# Patient Record
Sex: Female | Born: 1980 | Race: Black or African American | Hispanic: No | Marital: Single | Smoking: Never smoker
Health system: Southern US, Community
[De-identification: ages and names within clinical notes are randomized; demographics above are authoritative.]

## PROBLEM LIST (undated history)

## (undated) DIAGNOSIS — G43909 Migraine, unspecified, not intractable, without status migrainosus: Secondary | ICD-10-CM

## (undated) DIAGNOSIS — E282 Polycystic ovarian syndrome: Secondary | ICD-10-CM

## (undated) DIAGNOSIS — E559 Vitamin D deficiency, unspecified: Secondary | ICD-10-CM

## (undated) DIAGNOSIS — R5383 Other fatigue: Secondary | ICD-10-CM

## (undated) DIAGNOSIS — N912 Amenorrhea, unspecified: Secondary | ICD-10-CM

## (undated) HISTORY — DX: Amenorrhea, unspecified: N91.2

## (undated) HISTORY — DX: Migraine, unspecified, not intractable, without status migrainosus: G43.909

## (undated) HISTORY — DX: Other fatigue: R53.83

## (undated) HISTORY — DX: Vitamin D deficiency, unspecified: E55.9

## (undated) HISTORY — PX: FEMUR FRACTURE SURGERY: SHX633

## (undated) HISTORY — PX: KNEE SURGERY: SHX244

## (undated) HISTORY — PX: LEG SURGERY: SHX1003

## (undated) HISTORY — PX: FRACTURE SURGERY: SHX138

---

## 2014-06-03 ENCOUNTER — Encounter (HOSPITAL_COMMUNITY): Payer: Self-pay | Admitting: Emergency Medicine

## 2014-06-03 ENCOUNTER — Emergency Department (HOSPITAL_COMMUNITY): Payer: 59

## 2014-06-03 ENCOUNTER — Emergency Department (HOSPITAL_COMMUNITY)
Admission: EM | Admit: 2014-06-03 | Discharge: 2014-06-03 | Disposition: A | Discharge status: Home / Self Care | Payer: 59 | Attending: Emergency Medicine | Admitting: Emergency Medicine

## 2014-06-03 DIAGNOSIS — Z3202 Encounter for pregnancy test, result negative: Secondary | ICD-10-CM | POA: Insufficient documentation

## 2014-06-03 DIAGNOSIS — R0602 Shortness of breath: Secondary | ICD-10-CM | POA: Insufficient documentation

## 2014-06-03 DIAGNOSIS — Z791 Long term (current) use of non-steroidal anti-inflammatories (NSAID): Secondary | ICD-10-CM | POA: Diagnosis not present

## 2014-06-03 DIAGNOSIS — R079 Chest pain, unspecified: Secondary | ICD-10-CM | POA: Diagnosis present

## 2014-06-03 DIAGNOSIS — Z79899 Other long term (current) drug therapy: Secondary | ICD-10-CM | POA: Diagnosis not present

## 2014-06-03 DIAGNOSIS — Z793 Long term (current) use of hormonal contraceptives: Secondary | ICD-10-CM | POA: Insufficient documentation

## 2014-06-03 LAB — CBC
HCT: 40.4 % (ref 36.0–46.0)
Hemoglobin: 13.8 g/dL (ref 12.0–15.0)
MCH: 29.9 pg (ref 26.0–34.0)
MCHC: 34.2 g/dL (ref 30.0–36.0)
MCV: 87.4 fL (ref 78.0–100.0)
Platelets: 373 10*3/uL (ref 150–400)
RBC: 4.62 MIL/uL (ref 3.87–5.11)
RDW: 12.7 % (ref 11.5–15.5)
WBC: 6 10*3/uL (ref 4.0–10.5)

## 2014-06-03 LAB — URINE MICROSCOPIC-ADD ON

## 2014-06-03 LAB — D-DIMER, QUANTITATIVE: D-Dimer, Quant: 0.39 ug{FEU}/mL (ref 0.00–0.48)

## 2014-06-03 LAB — I-STAT TROPONIN, ED
TROPONIN I, POC: 0.01 ng/mL (ref 0.00–0.08)
Troponin i, poc: 0 ng/mL (ref 0.00–0.08)

## 2014-06-03 LAB — BASIC METABOLIC PANEL
ANION GAP: 13 (ref 5–15)
BUN: 9 mg/dL (ref 6–23)
CHLORIDE: 99 meq/L (ref 96–112)
CO2: 25 mEq/L (ref 19–32)
Calcium: 8.9 mg/dL (ref 8.4–10.5)
Creatinine, Ser: 0.98 mg/dL (ref 0.50–1.10)
GFR, EST AFRICAN AMERICAN: 89 mL/min — AB (ref >90)
GFR, EST NON AFRICAN AMERICAN: 77 mL/min — AB (ref >90)
Glucose, Bld: 88 mg/dL (ref 70–99)
POTASSIUM: 3.8 meq/L (ref 3.7–5.3)
SODIUM: 137 meq/L (ref 137–147)

## 2014-06-03 LAB — URINALYSIS, ROUTINE W REFLEX MICROSCOPIC
Bilirubin Urine: NEGATIVE
Glucose, UA: NEGATIVE mg/dL
Ketones, ur: 15 mg/dL — AB
Leukocytes, UA: NEGATIVE
Nitrite: NEGATIVE
Protein, ur: NEGATIVE mg/dL
Specific Gravity, Urine: 1.03 (ref 1.005–1.030)
Urobilinogen, UA: 0.2 mg/dL (ref 0.0–1.0)
pH: 5.5 (ref 5.0–8.0)

## 2014-06-03 LAB — PREGNANCY, URINE: Preg Test, Ur: NEGATIVE

## 2014-06-03 MED ORDER — MORPHINE SULFATE 2 MG/ML IJ SOLN
2.0000 mg | Freq: Once | INTRAMUSCULAR | Status: AC
  Administered 2014-06-03: 2 mg via INTRAVENOUS
  Filled 2014-06-03: qty 1

## 2014-06-03 MED ORDER — SODIUM CHLORIDE 0.9 % IV SOLN
INTRAVENOUS | Status: DC
  Administered 2014-06-03: 14:00:00 via INTRAVENOUS

## 2014-06-03 MED ORDER — TRAMADOL HCL 50 MG PO TABS: 50.0000 mg | ORAL_TABLET | Freq: Four times a day (QID) | ORAL | Status: DC | PRN

## 2014-06-03 MED ORDER — ONDANSETRON HCL 4 MG/2ML IJ SOLN
4.0000 mg | Freq: Once | INTRAMUSCULAR | Status: AC
  Administered 2014-06-03: 4 mg via INTRAVENOUS
  Filled 2014-06-03: qty 2

## 2014-06-03 MED ORDER — SODIUM CHLORIDE 0.9 % IV BOLUS (SEPSIS)
250.0000 mL | Freq: Once | INTRAVENOUS | Status: AC
  Administered 2014-06-03: 250 mL via INTRAVENOUS

## 2014-06-03 NOTE — ED Provider Notes (Signed)
CSN: [id number]     Arrival date & time 06/03/14  1200 History   First MD Initiated Contact with Patient 06/03/14 1203     Chief Complaint  Patient presents with  . Chest Pain     (Consider location/radiation/quality/duration/timing/severity/associated sxs/prior Treatment) Patient is a 33 y.o. female presenting with chest pain. The history is provided by the patient and the EMS personnel.  Chest Pain Associated symptoms: shortness of breath   Associated symptoms: no abdominal pain, no back pain, no fever, no headache, no nausea and not vomiting   patient with acute onset of chest pain at work this morning at 10:30. Pain is 9 out of 10. Was given aspirin at work before EMS arrived.patient says the pain was substernal tightness to sharp waxing and waning. Made worse by taking a deep breath. Nonradiating. No fever no upper rest for infection no nausea no vomiting. Never had pain like this before.  History reviewed. No pertinent past medical history. Past Surgical History  Procedure Laterality Date  . Fracture surgery    . Knee surgery     History reviewed. No pertinent family history. History  Substance Use Topics  . Smoking status: Never Smoker   . Smokeless tobacco: Never Used  . Alcohol Use: Yes     Comment: occasionally    OB History    No data available     Review of Systems  Constitutional: Negative for fever.  HENT: Negative for congestion.   Eyes: Negative for visual disturbance.  Respiratory: Positive for shortness of breath.   Cardiovascular: Positive for chest pain.  Gastrointestinal: Negative for nausea, vomiting and abdominal pain.  Genitourinary: Negative for dysuria.  Musculoskeletal: Negative for back pain.  Skin: Negative for rash.  Neurological: Negative for headaches.  Hematological: Does not bruise/bleed easily.  Psychiatric/Behavioral: Negative for confusion.      Allergies  Adhesive  Home Medications   Prior to Admission medications    Medication Sig Start Date End Date Taking? Authorizing Provider  meloxicam (MOBIC) 15 MG tablet Take 15 mg by mouth daily.   Yes Historical Provider, MD  valACYclovir (VALTREX) 500 MG tablet Take 250-500 mg by mouth 2 (two) times daily. Take 1 tablet by mouth twice a day for 5 days, then take 1 tablet every day as needed   Yes Historical Provider, MD  traMADol (ULTRAM) 50 MG tablet Take 1 tablet (50 mg total) by mouth every 6 (six) hours as needed. 06/03/14   Vanetta MuldersScott Katalaya Beel, MD   BP 139/78 mmHg  Pulse 88  Temp(Src) 98.7 F (37.1 C) (Oral)  Resp 20  Ht 5\' 7"  (1.702 m)  Wt 218 lb (98.884 kg)  BMI 34.14 kg/m2  SpO2 97%  LMP 06/01/2014 Physical Exam  Constitutional: She is oriented to person, place, and time. She appears well-developed and well-nourished.  HENT:  Head: Normocephalic and atraumatic.  Mouth/Throat: Oropharynx is clear and moist.  Eyes: Conjunctivae and EOM are normal.  Neck: Normal range of motion. Neck supple.  Cardiovascular: Normal rate, regular rhythm and normal heart sounds.   No murmur heard. Pulmonary/Chest: Effort normal and breath sounds normal.  Abdominal: Soft. Bowel sounds are normal. There is no tenderness.  Musculoskeletal: Normal range of motion.  Neurological: She is alert and oriented to person, place, and time. No cranial nerve deficit. She exhibits normal muscle tone. Coordination normal.  Skin: Skin is warm. No rash noted.  Nursing note and vitals reviewed.   ED Course  Procedures (including critical care time) Labs Review  Labs Reviewed  BASIC METABOLIC PANEL - Abnormal; Notable for the following:    GFR calc non Af Amer 77 (*)    GFR calc Af Amer 89 (*)    All other components within normal limits  URINALYSIS, ROUTINE W REFLEX MICROSCOPIC - Abnormal; Notable for the following:    Color, Urine AMBER (*)    Hgb urine dipstick LARGE (*)    Ketones, ur 15 (*)    All other components within normal limits  URINE MICROSCOPIC-ADD ON - Abnormal;  Notable for the following:    Squamous Epithelial / LPF FEW (*)    Bacteria, UA FEW (*)    All other components within normal limits  CBC  PREGNANCY, URINE  D-DIMER, QUANTITATIVE  I-STAT TROPOININ, ED  POC URINE PREG, ED  I-STAT TROPOININ, ED   Results for orders placed or performed during the hospital encounter of 06/03/14  CBC  Result Value Ref Range   WBC 6.0 4.0 - 10.5 K/uL   RBC 4.62 3.87 - 5.11 MIL/uL   Hemoglobin 13.8 12.0 - 15.0 g/dL   HCT 16.140.4 09.636.0 - 04.546.0 %   MCV 87.4 78.0 - 100.0 fL   MCH 29.9 26.0 - 34.0 pg   MCHC 34.2 30.0 - 36.0 g/dL   RDW 40.912.7 81.111.5 - 91.415.5 %   Platelets 373 150 - 400 K/uL  Basic metabolic panel  Result Value Ref Range   Sodium 137 137 - 147 mEq/L   Potassium 3.8 3.7 - 5.3 mEq/L   Chloride 99 96 - 112 mEq/L   CO2 25 19 - 32 mEq/L   Glucose, Bld 88 70 - 99 mg/dL   BUN 9 6 - 23 mg/dL   Creatinine, Ser 7.820.98 0.50 - 1.10 mg/dL   Calcium 8.9 8.4 - 95.610.5 mg/dL   GFR calc non Af Amer 77 (L) >90 mL/min   GFR calc Af Amer 89 (L) >90 mL/min   Anion gap 13 5 - 15  Urinalysis, Routine w reflex microscopic  Result Value Ref Range   Color, Urine AMBER (A) YELLOW   APPearance CLEAR CLEAR   Specific Gravity, Urine 1.030 1.005 - 1.030   pH 5.5 5.0 - 8.0   Glucose, UA NEGATIVE NEGATIVE mg/dL   Hgb urine dipstick LARGE (A) NEGATIVE   Bilirubin Urine NEGATIVE NEGATIVE   Ketones, ur 15 (A) NEGATIVE mg/dL   Protein, ur NEGATIVE NEGATIVE mg/dL   Urobilinogen, UA 0.2 0.0 - 1.0 mg/dL   Nitrite NEGATIVE NEGATIVE   Leukocytes, UA NEGATIVE NEGATIVE  Pregnancy, urine  Result Value Ref Range   Preg Test, Ur NEGATIVE NEGATIVE  Urine microscopic-add on  Result Value Ref Range   Squamous Epithelial / LPF FEW (A) RARE   WBC, UA 0-2 <3 WBC/hpf   RBC / HPF 7-10 <3 RBC/hpf   Bacteria, UA FEW (A) RARE  D-dimer, quantitative  Result Value Ref Range   D-Dimer, Quant 0.39 0.00 - 0.48 ug/mL-FEU  I-stat troponin, ED (not at [facility])  Result Value Ref Range   Troponin i,  poc 0.00 0.00 - 0.08 ng/mL   Comment 3          I-stat troponin, ED  Result Value Ref Range   Troponin i, poc 0.01 0.00 - 0.08 ng/mL   Comment 3             Imaging Review Dg Chest 2 View  06/03/2014   CLINICAL DATA:  Chest pain  EXAM: CHEST  2 VIEW  COMPARISON:  None.  FINDINGS: The heart size and mediastinal contours are within normal limits. Both lungs are clear. The visualized skeletal structures are unremarkable.  IMPRESSION: No active cardiopulmonary disease.   Electronically Signed   By: Alcide Clever M.D.   On: 06/03/2014 14:03     EKG Interpretation   Date/Time:  Thursday June 03 2014 12:04:27 EST Ventricular Rate:  96 PR Interval:  137 QRS Duration: 60 QT Interval:  350 QTC Calculation: 442 R Axis:   22 Text Interpretation:  Sinus rhythm Low voltage, precordial leads  Borderline T abnormalities, anterior leads No previous ECGs available  Confirmed by Anesa Fronek  MD, Yasheka Fossett ([id number]) on 06/03/2014 12:59:25 PM      MDM   Final diagnoses:  Chest pain    Patient was sudden onset of chest pain at 10:30 this morning. Tightness to a sharp pain that waxes and wanes. Does not go away completely. Made worse with deep breaths. Not associated with fever no upper respiratory infections. No nausea no vomiting. Never anything like this before. Was given an aspirin at work. Pain was described as 9 out of 10.  Workup including evaluation for possible pulmonary embolus was negative. Patient troponin 2 was negative. Chest x-rays negative for any pneumonia pneumothorax or pulmonary edema. Patient's d-dimer was negative making not consistent with pulmonary embolus. Pain seems to be pleuritic in nature. We'll treat with pain medicine and that resource guide for follow-up with her record Dr.  Donn Pierini thought process on doing the d-dimer was. Based on perc criteria patient is on birth control pills.    Vanetta Mulders, MD 06/03/14 [phone]

## 2014-06-03 NOTE — ED Notes (Signed)
Pt presents to ed via ems with complaints of chest pain. Pt was at work and experienced sudden onset of central chest pain that radiates to the right chest, denies radiation anywhere else. Pt denies n/v, diaphoresis, or SOB. Pt does state when the chest pain first began she got really hot. Pt complaining of chest pain at this time 9/10. Does not appear to be in any distress.

## 2014-06-03 NOTE — ED Notes (Signed)
Pt has NS 250 bolus going now, will start 100 ml/hr after finished.

## 2014-06-03 NOTE — Discharge Instructions (Signed)
Workup for the chest pain without any significant findings. Take the tramadol as needed. Resource guide provided to help you find a regular doctor. May consider the wellness clinic on [address] as a place to start. Return for any new or worse symptoms. Work note provided.   Emergency Department Resource Guide 1) Find a Doctor and Pay Out of Pocket Although you won't have to find out who is covered by your insurance plan, it is a good idea to ask around and get recommendations. You will then need to call the office and see if the doctor you have chosen will accept you as a new patient and what types of options they offer for patients who are self-pay. Some doctors offer discounts or will set up payment plans for their patients who do not have insurance, but you will need to ask so you aren't surprised when you get to your appointment.  2) Contact Your Local Health Department Not all health departments have doctors that can see patients for sick visits, but many do, so it is worth a call to see if yours does. If you don't know where your local health department is, you can check in your phone book. The CDC also has a tool to help you locate your state's health department, and many state websites also have listings of all of their local health departments.  3) Find a Walk-in Clinic If your illness is not likely to be very severe or complicated, you may want to try a walk in clinic. These are popping up all over the country in pharmacies, drugstores, and shopping centers. They're usually staffed by nurse practitioners or physician assistants that have been trained to treat common illnesses and complaints. They're usually fairly quick and inexpensive. However, if you have serious medical issues or chronic medical problems, these are probably not your best option.  No Primary Care Doctor: - Call Health Connect at  [phone] - they can help you locate a primary care doctor that  accepts your insurance, provides  certain services, etc. - Physician Referral Service- [phone]  Chronic Pain Problems: Organization         Address  Phone   Notes  [facility]  [phone] Patients need to be referred by their primary care doctor.   Medication Assistance: Organization         Address  Phone   Notes  [facility] Medication [facility] [address] [place], [place] [address] [phone]) [phone] --Must be a resident of [facility] [place] -- Must have NO insurance coverage whatsoever (no Medicaid/ Medicare, etc.) -- The pt. MUST have a primary care doctor that directs their care regularly and follows them in the community   MedAssist  [phone]   [employer]  [phone]    Agencies that provide inexpensive medical care: Organization         Address  Phone   Notes  [facility]  [phone](336) [phone]   [facility]    [phone]   [facility] [address] [place], [place] [address] [phone]   [facility] [address], [place] [phone]   Planned Parenthood    [phone]   [facility]    [phone](336) [phone]   [facility]  [address], [place] Phone:  [phone](336) [phone], Fax:  [phone] Hours of Operation:  9 am - 6 pm,  M-F.  Also accepts Medicaid/Medicare and self-pay.  [facility]  [address], [place] Phone: [phone](336) [phone], Fax: [phone]. Hours of Operation:  8:30 am - 5:30 pm, M-F.  Also accepts Medicaid and self-pay.  [facility] [place] [address], [place] Phone: [phone]) [phone]   [facility] [address], [place], [place] [phone], Ext. [phone] Mondays & Thursdays: 7-9 AM.  First 15 patients are seen on a first come, first serve basis.    Medicaid-accepting [facility] Providers:  Organization         Address  Phone   Notes  [facility] [address], [place] [phone] Also accepts self-pay patients.  [facility] [address], [place]  [phone]) [phone]   [facility] [address], [place] [phone](336) [phone]   [facility] [address] [place] [address], [place] [phone]) [phone]   Renaye RakersVeita Bland [address], [place]   [phone] Only accepts [place] Access [place] patients after they have their name applied to their card.   Self-Pay (no insurance) in [facility]:  Organization         Address  Phone   Notes  Sickle Cell Patients, [facility] [address], [place] [phone]) [phone]   [facility] [address], [place] [phone]) [phone]   [facility] [place]  [address],  [phone](336) [phone]   [facility]/Dr. Osei-Bonsu  [address] [place] [address], [place] or [phone], [place] [phone] Phone number for both [place] and [place] locations is the same.  [facility] [address], [place] [phone]) [phone]   [facility] [address] [place] [address], [place] or [address] [phone](336) [phone]   [facility] [address], [place] [phone](336) [phone], phone; [phone]) [phone], fax Sees patients 1st and 3rd Saturday of every month.  Must not qualify for public or private insurance (i.e. Medicaid, Medicare, [place] Health Choice, Veterans' Benefits)  Household income should be no more than 200% of the poverty level The clinic cannot treat you if you are pregnant or think you are pregnant  Sexually transmitted diseases are not treated at the clinic.    Dental Care: Organization         Address  Phone  Notes  [facility] of [facility] [address], [place] [phone]) [phone]  Accepts children up to age 31 who are enrolled in [place]icaid or [place] Health Choice; pregnant women with a Medicaid card; and children who have applied for Medicaid or [place] Health Choice, but were declined, whose parents can pay a reduced fee at time of service.  [facility] Department of [facility] [place]  [address], [place] [phone] Accepts children up to age 31 who are enrolled in [place]icaid or [place] Health Choice; pregnant women with a Medicaid card; and children who have applied for Medicaid or [place] Health Choice, but were declined, whose parents can pay a reduced fee at time of service.  [facility]  [address], [place] [phone]) [phone] Patients are seen by appointment only. Walk-ins are not accepted. [facility] will see patients 31 years of age and older. Monday -  Tuesday (8am-5pm) Most Wednesdays (8:30-5pm) $30 per visit, cash only  [facility]  [address], [facility] [phone]) [phone] Patients are seen by appointment only. Walk-ins are not accepted. [facility] will see patients 31 years of age and older. One Wednesday Evening (Monthly: Volunteer Based).  $30 per visit, cash only  [employer] [facility]  [phone] for adults; Children under age 954, call Graduate Pediatric Dentistry at [phone]) [phone]. Children aged 274-14, please call [phone] to request a pediatric application.  Dental services are provided in all areas of dental care including fillings, crowns and bridges, complete and partial dentures, implants, gum treatment, root canals, and extractions. Preventive care is also provided. Treatment is provided to both adults and children. Patients are selected via a lottery and there is often a waiting list.   [facility] [address], [place]  [phone]) [phone] [email or web address]   [facility] [address], [place], [place] [phone], Ext. [phone] Second  and Fourth Thursday of each month, opens at 6:30 AM; Clinic ends at 9 AM.  Patients are seen on a first-come first-served basis, and a limited number are seen during each clinic.   [facility]  [address], [place], [place] [phone]) [phone]   Eligibility Requirements You must have lived in [place], [place], or [place] counties for at least the last three months.   You cannot be eligible for state or federal sponsored National Cityhealthcare insurance, including CIGNAVeterans Administration, [place]icaid, or [employer] EntertainmentMedicare.   You generally cannot be eligible for healthcare insurance through your employer.    How to apply: Eligibility screenings are held every Tuesday and Wednesday afternoon from 1:00 pm until 4:00 pm. You do not need an appointment for the interview!  [facility] [address], [place], [place] [phone]   [facility]  [phone]   [facility]  [phone]   [facility]  [phone]    Behavioral Health Resources in the Community: Intensive Outpatient Programs Organization         Address  Phone  Notes  [facility] [address], [place], [place] [phone]   [facility] [address], [place], [place] [phone]   [facility] [address], [place], [place]  [phone]   [facility] [address],  [place], [place] [phone] or [phone]   Substance Abuse Resources Organization         Address  Phone  Notes  [facility] Drug Services  [phone]   [facility]  [phone]   [facility]  [phone]   [facility]  [phone]   Residential & Outpatient Substance Abuse Program  [phone]   Psychological Services Organization         Address  Phone  Notes  [facility]  [phone]- [phone]   [facility]  [phone]- [phone]   [facility] [address], [place] [phone] or [phone]    Mobile Crisis Teams Organization         Address  Phone  Notes  [facility], Mobile Crisis Care Unit  [phone]   [facility]  [address] [place], [place] [phone]   Doristine LocksSharon DeEsch [address] [place] [phone]    Self-Help/Support Groups Organization         Address  Phone  Notes  [facility]ental Health Assoc[facility] of [place] - variety of support groups  [place] Call for more information  Narcotics Anonymous (NA), Caring Services [address], [employer] [place]  2 meetings at this location   Special educational needs teacher         Address  Phone  Notes  ASAP Residential Treatment [place],    [place]  [phone]   [facility]  [address], [place] [address]5790, [place], [place]   [place], [place] [phone] Admissions: 8am-3pm M-F  Incentives Substance [place] [address],    [place], [place] [phone]   [facility] [address] [place], [place], [place]   [facility] [address]  [place], [place]   [facility]ight Programs - Intensive Outpatient [address], [place], [place]   [facility] [address]  [place], [place] [phone] or [phone]   [facility] (RTS) [address] [place], [place] Accepts Medicaid  [facility] [address],  [place] [phone] Substance Abuse/Addiction Treatment   [facility] Organization         Address  Phone  Notes  [facility]  [phone]   Domenic Schwab, PhD [address] [place], [place]   [phone] or [phone]   [place], [place]  [phone]   [facility] [address], [place], [place] [phone] Insurance/Medicaid/sponsorship through [facility] and Families [address]                                    [place], [place] [phone] [place] [address] [place], [place] [phone]    Dr. Adele Schilder  [phone]   [facility] Dept. 1) [address], [place] 2) [place] 3)  [place] [address], [place] [phone]  [phone]   [place] [phone] or [phone] (After Hours)

## 2014-06-03 NOTE — ED Notes (Signed)
Pt reports pain upon taking a deep breath.

## 2015-06-28 ENCOUNTER — Emergency Department (HOSPITAL_COMMUNITY)
Admission: EM | Admit: 2015-06-28 | Discharge: 2015-06-29 | Disposition: A | Discharge status: Home / Self Care | Payer: 59 | Attending: Emergency Medicine | Admitting: Emergency Medicine

## 2015-06-28 ENCOUNTER — Encounter (HOSPITAL_COMMUNITY): Payer: Self-pay | Admitting: Emergency Medicine

## 2015-06-28 DIAGNOSIS — Y9389 Activity, other specified: Secondary | ICD-10-CM | POA: Diagnosis not present

## 2015-06-28 DIAGNOSIS — Z8639 Personal history of other endocrine, nutritional and metabolic disease: Secondary | ICD-10-CM | POA: Insufficient documentation

## 2015-06-28 DIAGNOSIS — R590 Localized enlarged lymph nodes: Secondary | ICD-10-CM | POA: Diagnosis not present

## 2015-06-28 DIAGNOSIS — Y9289 Other specified places as the place of occurrence of the external cause: Secondary | ICD-10-CM | POA: Diagnosis not present

## 2015-06-28 DIAGNOSIS — W010XXA Fall on same level from slipping, tripping and stumbling without subsequent striking against object, initial encounter: Secondary | ICD-10-CM | POA: Diagnosis not present

## 2015-06-28 DIAGNOSIS — M25561 Pain in right knee: Secondary | ICD-10-CM

## 2015-06-28 DIAGNOSIS — S8991XA Unspecified injury of right lower leg, initial encounter: Secondary | ICD-10-CM | POA: Diagnosis not present

## 2015-06-28 DIAGNOSIS — R591 Generalized enlarged lymph nodes: Secondary | ICD-10-CM

## 2015-06-28 DIAGNOSIS — Y998 Other external cause status: Secondary | ICD-10-CM | POA: Diagnosis not present

## 2015-06-28 HISTORY — DX: Polycystic ovarian syndrome: E28.2

## 2015-06-28 LAB — CBC WITH DIFFERENTIAL/PLATELET
BASOS ABS: 0 10*3/uL (ref 0.0–0.1)
BASOS PCT: 0 %
Eosinophils Absolute: 0.2 10*3/uL (ref 0.0–0.7)
Eosinophils Relative: 2 %
HEMATOCRIT: 42.3 % (ref 36.0–46.0)
HEMOGLOBIN: 14.5 g/dL (ref 12.0–15.0)
Lymphocytes Relative: 44 %
Lymphs Abs: 3.6 10*3/uL (ref 0.7–4.0)
MCH: 29.4 pg (ref 26.0–34.0)
MCHC: 34.3 g/dL (ref 30.0–36.0)
MCV: 85.6 fL (ref 78.0–100.0)
Monocytes Absolute: 0.6 10*3/uL (ref 0.1–1.0)
Monocytes Relative: 8 %
NEUTROS ABS: 3.8 10*3/uL (ref 1.7–7.7)
NEUTROS PCT: 46 %
Platelets: 354 10*3/uL (ref 150–400)
RBC: 4.94 MIL/uL (ref 3.87–5.11)
RDW: 12.8 % (ref 11.5–15.5)
WBC: 8.1 10*3/uL (ref 4.0–10.5)

## 2015-06-28 MED ORDER — SODIUM CHLORIDE 0.9 % IV BOLUS (SEPSIS)
1000.0000 mL | Freq: Once | INTRAVENOUS | Status: AC
  Administered 2015-06-28: 1000 mL via INTRAVENOUS

## 2015-06-28 MED ORDER — DEXAMETHASONE SODIUM PHOSPHATE 10 MG/ML IJ SOLN
10.0000 mg | Freq: Once | INTRAMUSCULAR | Status: AC
  Administered 2015-06-28: 10 mg via INTRAVENOUS
  Filled 2015-06-28: qty 1

## 2015-06-28 MED ORDER — BENZONATATE 100 MG PO CAPS
200.0000 mg | ORAL_CAPSULE | Freq: Once | ORAL | Status: AC
  Administered 2015-06-28: 200 mg via ORAL
  Filled 2015-06-28: qty 2

## 2015-06-28 MED ORDER — IPRATROPIUM-ALBUTEROL 0.5-2.5 (3) MG/3ML IN SOLN
3.0000 mL | RESPIRATORY_TRACT | Status: DC
  Administered 2015-06-28: 3 mL via RESPIRATORY_TRACT
  Filled 2015-06-28: qty 3

## 2015-06-28 NOTE — ED Provider Notes (Signed)
CSN: [id number]     Arrival date & time 06/28/15  2119 History   By signing my name below, I, Arianna Nassar, attest that this documentation has been prepared under the direction and in the presence of Tomasita CrumbleAdeleke Rosser [place], MD. Electronically Signed: Octavia HeirArianna Nassar, ED Scribe. 06/28/2015. 11:23 PM.     Chief Complaint  Patient presents with  . Fall  . Knee Pain  . Cyst  . Sore Throat     The history is provided by the patient. No language interpreter was used.   HPI Comments: Katrina Livingston is a 34 y.o. female who presents to the Emergency Department presenting with multiple complains. Pt is complaining of constant, moderate, "throbbing" right knee pain onset last night. Pt endorses extreme pain when bending her knee. Pt reports she fell last night when she slipped and her right knee folded underneath her. She reports hitting her head when she fell but she did not lose consciousness. She has surgery on her right knee for a torn ligament and just wanted to check and make sure she did not tear anything. She has taken ibuprofen to alleviate the pain to alleviate the pain with minimal relief. Pt also complains of a sore throat and tightness due to excess crying after a death in the family. Pt describes the pain as a hiccup feeling. She has a dry cough as well. Pt reports having "knot" like area on the left side of her neck for the past 3 days. Pt denies fever and diaphoresis.  Past Medical History  Diagnosis Date  . PCOS (polycystic ovarian syndrome)    Past Surgical History  Procedure Laterality Date  . Fracture surgery    . Knee surgery    . Leg surgery     No family history on file. Social History  Substance Use Topics  . Smoking status: Never Smoker   . Smokeless tobacco: Never Used  . Alcohol Use: Yes     Comment: occasionally    OB History    No data available     Review of Systems  A complete 10 system review of systems was obtained and all systems are negative except as noted  in the HPI and PMH.    Allergies  Adhesive  Home Medications   Prior to Admission medications   Medication Sig Start Date End Date Taking? Authorizing Provider  valACYclovir (VALTREX) 500 MG tablet Take 250-500 mg by mouth 2 (two) times daily. Take 1 tablet by mouth twice a day for 5 days, then take 1 tablet every day as needed   Yes Historical Provider, MD   Triage vitals: BP 151/99 mmHg  Pulse 89  Temp(Src) 98.5 F (36.9 C) (Oral)  Resp 20  SpO2 98%  LMP  Physical Exam  Constitutional: She is oriented to person, place, and time. She appears well-developed and well-nourished. No distress.  HENT:  Head: Normocephalic and atraumatic.  Nose: Nose normal.  Mouth/Throat: Oropharynx is clear and moist. No oropharyngeal exudate.  Eyes: Conjunctivae and EOM are normal. Pupils are equal, round, and reactive to light. No scleral icterus.  Neck: Normal range of motion. Neck supple. No JVD present. No tracheal deviation present. No thyromegaly present.  1.5 cm mobile firm mass to the left posterior neck, mild TTP, frequent hiccupping on examination  Cardiovascular: Normal rate, regular rhythm and normal heart sounds.  Exam reveals no gallop and no friction rub.   No murmur heard. Pulmonary/Chest: Effort normal and breath sounds normal. No respiratory distress. She has  no wheezes. She exhibits no tenderness.  Abdominal: Soft. Bowel sounds are normal. She exhibits no distension and no mass. There is no tenderness. There is no rebound and no guarding.  Musculoskeletal: Normal range of motion. She exhibits no edema or tenderness.  Limited ROM of right knee secondary to pain Normal joint stability, normal pulses and sensation distally  Lymphadenopathy:    She has no cervical adenopathy.  Neurological: She is alert and oriented to person, place, and time. No cranial nerve deficit. She exhibits normal muscle tone.  Skin: Skin is warm and dry. No rash noted. No erythema. No pallor.  Nursing note  and vitals reviewed.   ED Course  Procedures  DIAGNOSTIC STUDIES: Oxygen Saturation is 98% on RA, normal by my interpretation.  COORDINATION OF CARE:  11:19 PM Discussed treatment plan which includes x-ray of left knee, CT of neck with pt at bedside and pt agreed to plan.  Labs Review Labs Reviewed  I-STAT CHEM 8, ED - Abnormal; Notable for the following:    Potassium 3.4 (*)    Calcium, Ion 1.06 (*)    Hemoglobin 19.0 (*)    HCT 56.0 (*)    All other components within normal limits  CBC WITH DIFFERENTIAL/PLATELET    Imaging Review Dg Knee 2 Views Right  06/29/2015  CLINICAL DATA:  Status post fall, with difficulty bending the right knee, and right knee pain. Initial encounter. EXAM: RIGHT KNEE - 1-2 VIEW COMPARISON:  None. FINDINGS: There is no evidence of fracture or dislocation. The joint spaces are preserved. Marginal osteophytes are noted at the lateral compartment, and at the tibial spine. Wall osteophytes are also noted at the intercondylar notch. No significant joint effusion is seen. The visualized soft tissues are normal in appearance. IMPRESSION: 1. No evidence of fracture or dislocation. 2. Minimal degenerative change noted at the right knee. Electronically Signed   By: Roanna Raider M.D.   On: 06/29/2015 01:22   Ct Soft Tissue Neck W Contrast  06/29/2015  CLINICAL DATA:  Knot on the left side of the neck, with difficulty breathing when talking. Initial encounter. EXAM: CT NECK WITH CONTRAST TECHNIQUE: Multidetector CT imaging of the neck was performed using the standard protocol following the bolus administration of intravenous contrast. CONTRAST:  75mL OMNIPAQUE IOHEXOL 300 MG/ML  SOLN COMPARISON:  Cervical spine radiographs performed 03/31/2014 FINDINGS: Pharynx and larynx: The nasopharynx, oropharynx and hypopharynx are grossly unremarkable in appearance. The palatine tonsils are somewhat prominent but relatively symmetric. The adenoids are unremarkable. No significant  soft tissue edema is seen. There is no evidence of abscess. The parapharyngeal fat planes are preserved. The valleculae and piriform sinuses are within normal limits. The vocal cords and proximal trachea are unremarkable. Salivary glands: The parotid and submandibular glands are unremarkable in appearance. Thyroid: The thyroid gland is unremarkable in appearance. Lymph nodes: No cervical lymphadenopathy is appreciated. Vascular: The visualized vasculature is unremarkable. There is no evidence of vascular compromise. Limited intracranial: The visualized portions of the brain are unremarkable in appearance. Visualized orbits: The visualized portions of the orbits are grossly unremarkable. Mastoids and visualized paranasal sinuses: The visualized paranasal sinuses and mastoid air cells are well-aerated. Skeleton: No acute osseous abnormalities are seen. A few dental caries are suggested. Upper chest: The visualized lung apices are grossly clear. The superior mediastinum is grossly unremarkable in appearance. IMPRESSION: 1. No focal abnormality seen to explain the patient's symptoms. The left side of the neck is grossly unremarkable in appearance. 2. Few dental  caries suggested. Electronically Signed   By: Roanna Raider M.D.   On: 06/29/2015 00:52   I have personally reviewed and evaluated these images and lab results as part of my medical decision-making.   EKG Interpretation None      MDM   Final diagnoses:  None   Patient presents to the ED for  Multiple complaints.  She fell yesterday on her knee and we will obtain an xray.  Joint stable on exam.  Also complains of frequent hiccups and neck mass.  CT pending.  She was given breathing treatment and tessalon pearles for treatment.  Decadron for sore throat.    Upon repeat evaluation, patient symmetrically feels better. X-ray and CT scans are negative. She appears well in no acute distress, signs are within her normal limits and she is safe for  discharge.  I personally performed the services described in this documentation, which was scribed in my presence. The recorded information has been reviewed and is accurate.     Tomasita Crumble, MD 06/29/15 0300

## 2015-06-28 NOTE — ED Notes (Signed)
Pt. presents with multiple complaints : reports pain at right knee after she slipped and fell on wet grass last night , " knot " at left neck for 3 days , sore throat/tightness after crying a lot last week after a death in there family. Respirations unlabored .

## 2015-06-29 ENCOUNTER — Emergency Department (HOSPITAL_COMMUNITY): Payer: 59

## 2015-06-29 ENCOUNTER — Encounter (HOSPITAL_COMMUNITY): Payer: Self-pay | Admitting: Radiology

## 2015-06-29 ENCOUNTER — Encounter (HOSPITAL_COMMUNITY): Payer: Self-pay | Admitting: Emergency Medicine

## 2015-06-29 ENCOUNTER — Emergency Department (INDEPENDENT_AMBULATORY_CARE_PROVIDER_SITE_OTHER)
Admission: EM | Admit: 2015-06-29 | Discharge: 2015-06-29 | Disposition: A | Discharge status: Home / Self Care | Payer: 59 | Source: Home / Self Care | Attending: Emergency Medicine | Admitting: Emergency Medicine

## 2015-06-29 DIAGNOSIS — R479 Unspecified speech disturbances: Secondary | ICD-10-CM | POA: Diagnosis not present

## 2015-06-29 DIAGNOSIS — R591 Generalized enlarged lymph nodes: Secondary | ICD-10-CM

## 2015-06-29 LAB — I-STAT CHEM 8, ED
BUN: 16 mg/dL (ref 6–20)
Calcium, Ion: 1.06 mmol/L — ABNORMAL LOW (ref 1.12–1.23)
Chloride: 101 mmol/L (ref 101–111)
Creatinine, Ser: 1 mg/dL (ref 0.44–1.00)
Glucose, Bld: 95 mg/dL (ref 65–99)
HEMATOCRIT: 56 % — AB (ref 36.0–46.0)
HEMOGLOBIN: 19 g/dL — AB (ref 12.0–15.0)
Potassium: 3.4 mmol/L — ABNORMAL LOW (ref 3.5–5.1)
SODIUM: 138 mmol/L (ref 135–145)
TCO2: 26 mmol/L (ref 0–100)

## 2015-06-29 MED ORDER — ALBUTEROL SULFATE HFA 108 (90 BASE) MCG/ACT IN AERS: 2.0000 | INHALATION_SPRAY | RESPIRATORY_TRACT | Status: AC | PRN

## 2015-06-29 MED ORDER — IOHEXOL 300 MG/ML  SOLN
75.0000 mL | Freq: Once | INTRAMUSCULAR | Status: AC | PRN
  Administered 2015-06-29: 75 mL via INTRAVENOUS

## 2015-06-29 NOTE — Discharge Instructions (Signed)
Please make an appointment to see the ENT specialist for additional evaluation of your speech issue. I think you do need a chest x-ray. The ENT doctor or your primary care doctor would be able to order this. Use the albuterol inhaler every 4 hours as needed for cough or shortness of breath. Follow-up as needed.

## 2015-06-29 NOTE — ED Notes (Signed)
Patient verbalized understanding of discharge instructions and denies any further needs or questions at this time. VS stable, patient assisted to ED lobby in wheelchair.

## 2015-06-29 NOTE — ED Notes (Signed)
Pt here with left neck palpable knot with increased SOB and dry cough C/o intermit speeches changes since last week SOB [facility] sx's yesterday and diagnosed with Lymphadenopathy

## 2015-06-29 NOTE — Discharge Instructions (Signed)
Knee Pain Katrina Livingston, your xray and CT scan are normal.  See a primary care doctor within 3 days for close follow up. If symptoms worsen, come back to the ED immediately.  Thank you. Knee pain is a common problem. It can have many causes. The pain often goes away by following your doctor's home care instructions. Treatment for ongoing pain will depend on the cause of your pain. If your knee pain continues, more tests may be needed to diagnose your condition. Tests may include X-rays or other imaging studies of your knee. HOME CARE  Take medicines only as told by your doctor.  Rest your knee and keep it raised (elevated) while you are resting.  Do not do things that cause pain or make your pain worse.  Avoid activities where both feet leave the ground at the same time, such as running, jumping rope, or doing jumping jacks.  Apply ice to the knee area:  Put ice in a plastic bag.  Place a towel between your skin and the bag.  Leave the ice on for 20 minutes, 2-3 times a day.  Ask your doctor if you should wear an elastic knee support.  Sleep with a pillow under your knee.  Lose weight if you are overweight. Being overweight can make your knee hurt more.  Do not use any tobacco products, including cigarettes, chewing tobacco, or electronic cigarettes. If you need help quitting, ask your doctor. Smoking may slow the healing of any bone and joint problems that you may have. GET HELP IF:  Your knee pain does not stop, it changes, or it gets worse.  You have a fever along with knee pain.  Your knee gives out or locks up.  Your knee becomes more swollen. GET HELP RIGHT AWAY IF:   Your knee feels hot to the touch.  You have chest pain or trouble breathing.   This information is not intended to replace advice given to you by your health care provider. Make sure you discuss any questions you have with your health care provider.   Document Released: 10/05/2008 Document Revised:  07/30/2014 Document Reviewed: 09/09/2013 Elsevier Interactive Patient Education 2016 [employer] Shortness of Breath Shortness of breath means you have trouble breathing. Shortness of breath needs medical care right away. HOME CARE   Do not smoke.  Avoid being around chemicals or things (paint fumes, dust) that may bother your breathing.  Rest as needed. Slowly begin your normal activities.  Only take medicines as told by your doctor.  Keep all doctor visits as told. GET HELP RIGHT AWAY IF:   Your shortness of breath gets worse.  You feel lightheaded, pass out (faint), or have a cough that is not helped by medicine.  You cough up blood.  You have pain with breathing.  You have pain in your chest, arms, shoulders, or belly (abdomen).  You have a fever.  You cannot walk up stairs or exercise the way you normally do.  You do not get better in the time expected.  You have a hard time doing normal activities even with rest.  You have problems with your medicines.  You have any new symptoms. MAKE SURE YOU:  Understand these instructions.  Will watch your condition.  Will get help right away if you are not doing well or get worse.   This information is not intended to replace advice given to you by your health care provider. Make sure you discuss any questions you have with  your health care provider.   Document Released: 12/26/2007 Document Revised: 07/14/2013 Document Reviewed: 09/24/2011 Elsevier Interactive Patient Education [employer]

## 2015-06-29 NOTE — ED Provider Notes (Signed)
CSN: [id number]     Arrival date & time 06/29/15  1401 History   First MD Initiated Contact with Patient 06/29/15 1417     Chief Complaint  Patient presents with  . Speech Problem  . Lymphadenopathy   (Consider location/radiation/quality/duration/timing/severity/associated sxs/prior Treatment) HPI  Katrina Livingston is a 34 year old woman here for evaluation of neck mass and speech problem. Katrina Livingston states Katrina Livingston started having problems about a week ago. Katrina Livingston describes a sensation where it felt like something was cutting off her air at her throat level. This was intermittent and mild. It has gradually gotten worse. Katrina Livingston also has a knot on the left side of her neck. Katrina Livingston states it has gone down since this morning. It is nontender. Katrina Livingston also reports a cough and some shortness of breath over the last week. More concerning to her is a speech problem. Over the last few days Katrina Livingston has had trouble initiating speech. Katrina Livingston states there is slight hesitancy to her words.  Katrina Livingston was seen in the emergency room yesterday for knee pain due to a fall and asked about these symptoms. They did a CT scan of the neck which was negative. They gave her a breathing treatment which Katrina Livingston states did help with the cough.  Past Medical History  Diagnosis Date  . PCOS (polycystic ovarian syndrome)    Past Surgical History  Procedure Laterality Date  . Fracture surgery    . Knee surgery    . Leg surgery     No family history on file. Social History  Substance Use Topics  . Smoking status: Never Smoker   . Smokeless tobacco: Never Used  . Alcohol Use: Yes     Comment: occasionally    OB History    No data available     Review of Systems As in history of present illness Allergies  Adhesive  Home Medications   Prior to Admission medications   Medication Sig Start Date End Date Taking? Authorizing Provider  albuterol (PROVENTIL HFA;VENTOLIN HFA) 108 (90 BASE) MCG/ACT inhaler Inhale 2 puffs into the lungs every 4 (four) hours as needed  for wheezing or shortness of breath (cough). 06/29/15   Charm Rings, MD  valACYclovir (VALTREX) 500 MG tablet Take 250-500 mg by mouth 2 (two) times daily. Take 1 tablet by mouth twice a day for 5 days, then take 1 tablet every day as needed    Historical Provider, MD   Meds Ordered and Administered this Visit  Medications - No data to display  BP 132/65 mmHg  Pulse 111  Temp(Src) 98.4 F (36.9 C) (Oral)  SpO2 99%  LMP 07/29/2014 No data found.   Physical Exam  Constitutional: Katrina Livingston is oriented to person, place, and time. Katrina Livingston appears well-developed and well-nourished. No distress.  HENT:  Mouth/Throat: Oropharynx is clear and moist. No oropharyngeal exudate.  Neck: Neck supple.  Cardiovascular: Normal rate, regular rhythm and normal heart sounds.   No murmur heard. Pulmonary/Chest: Effort normal and breath sounds normal. No respiratory distress. Katrina Livingston has no wheezes. Katrina Livingston has no rales.  Lymphadenopathy:    Katrina Livingston has cervical adenopathy (1 cm tender nonmobile lymph node in the left posterior chain).  Neurological: Katrina Livingston is alert and oriented to person, place, and time.    ED Course  Procedures (including critical care time)  Labs Review Labs Reviewed - No data to display  Imaging Review Dg Knee 2 Views Right  06/29/2015  CLINICAL DATA:  Status post fall, with difficulty bending the right knee, and  right knee pain. Initial encounter. EXAM: RIGHT KNEE - 1-2 VIEW COMPARISON:  None. FINDINGS: There is no evidence of fracture or dislocation. The joint spaces are preserved. Marginal osteophytes are noted at the lateral compartment, and at the tibial spine. Wall osteophytes are also noted at the intercondylar notch. No significant joint effusion is seen. The visualized soft tissues are normal in appearance. IMPRESSION: 1. No evidence of fracture or dislocation. 2. Minimal degenerative change noted at the right knee. Electronically Signed   By: Roanna RaiderJeffery  Chang M.D.   On: 06/29/2015 01:22   Ct Soft  Tissue Neck W Contrast  06/29/2015  CLINICAL DATA:  Knot on the left side of the neck, with difficulty breathing when talking. Initial encounter. EXAM: CT NECK WITH CONTRAST TECHNIQUE: Multidetector CT imaging of the neck was performed using the standard protocol following the bolus administration of intravenous contrast. CONTRAST:  75mL OMNIPAQUE IOHEXOL 300 MG/ML  SOLN COMPARISON:  Cervical spine radiographs performed 03/31/2014 FINDINGS: Pharynx and larynx: The nasopharynx, oropharynx and hypopharynx are grossly unremarkable in appearance. The palatine tonsils are somewhat prominent but relatively symmetric. The adenoids are unremarkable. No significant soft tissue edema is seen. There is no evidence of abscess. The parapharyngeal fat planes are preserved. The valleculae and piriform sinuses are within normal limits. The vocal cords and proximal trachea are unremarkable. Salivary glands: The parotid and submandibular glands are unremarkable in appearance. Thyroid: The thyroid gland is unremarkable in appearance. Lymph nodes: No cervical lymphadenopathy is appreciated. Vascular: The visualized vasculature is unremarkable. There is no evidence of vascular compromise. Limited intracranial: The visualized portions of the brain are unremarkable in appearance. Visualized orbits: The visualized portions of the orbits are grossly unremarkable. Mastoids and visualized paranasal sinuses: The visualized paranasal sinuses and mastoid air cells are well-aerated. Skeleton: No acute osseous abnormalities are seen. A few dental caries are suggested. Upper chest: The visualized lung apices are grossly clear. The superior mediastinum is grossly unremarkable in appearance. IMPRESSION: 1. No focal abnormality seen to explain the patient's symptoms. The left side of the neck is grossly unremarkable in appearance. 2. Few dental caries suggested. Electronically Signed   By: Roanna RaiderJeffery  Chang M.D.   On: 06/29/2015 00:52      MDM    1. Speech disturbance   2. Lymphadenopathy    Based on patient report lymphadenopathy is improving. With a negative neck CT this is likely a reactive lymph node. Her speech disturbance seems almost like a vocal cord spasm. I would like to get a chest x-ray to evaluate for mediastinal lymphadenopathy for possible sarcoidosis given associated cough and shortness of breath. However, patient is unable to stay for the chest x-ray due to needing to go to work. I have given her contact information for the ENT specialist on call for additional evaluation of possible vocal cord dysfunction. I recommended Katrina Livingston follow-up with her primary care doctor to discuss getting a chest x-ray.    Charm RingsErin J Bellevue Ducre, MD 06/29/15 [phone]

## 2015-07-22 ENCOUNTER — Ambulatory Visit: Payer: 59 | Attending: Otolaryngology

## 2015-07-22 DIAGNOSIS — R4789 Other speech disturbances: Secondary | ICD-10-CM

## 2015-07-22 NOTE — Therapy (Signed)
[facility] [address] [place], [place], [address] Phone: [phone]   Fax:  [phone]  Speech Language Pathology Evaluation  Patient Details  Name: Katrina Livingston MRN: [id number] Date of Birth: 07/20/1981 Referring Provider: Marvetta Gibbons M.D.  Encounter Date: 07/22/2015      End of Session - 07/22/15 1710    Visit Number 1   Number of Visits 17   Date for SLP Re-Evaluation 09/20/15   SLP Start Time 1533   SLP Stop Time  1615   SLP Time Calculation (min) 42 min   Activity Tolerance Patient tolerated treatment well      Past Medical History  Diagnosis Date  . PCOS (polycystic ovarian syndrome)     Past Surgical History  Procedure Laterality Date  . Fracture surgery    . Knee surgery    . Leg surgery      There were no vitals filed for this visit.  Visit Diagnosis: Fluency disorder          SLP Evaluation [facility] - 07/22/15 1505    SLP Visit Information   SLP Received On 07/22/15   Referring Provider Marvetta Gibbons M.D.   Onset Date 06-22-15   Medical Diagnosis Stuttering   General Information   HPI Pt with x6 mo rarely occurring hx of breathing "cutting off". 06-22-15 began stuttering spontaneously at work, after having incr'd frequency of the breathing issues over the previous Monday and Tuesday. Saw neurologist in [place] who said dysfluency was due to stress and put pt on alprazolam, and Escitalopram. Pt without relief of dysfluency - actually increased in frequency.    Prior Functional Status   Cognitive/Linguistic Baseline Within functional limits   Cognition   Overall Cognitive Status Within Functional Limits for tasks assessed   Auditory Comprehension   Overall Auditory Comprehension Appears within functional limits for tasks assessed   Verbal Expression   Overall Verbal Expression Appears within functional limits for tasks assessed   Oral Motor/Sensory Function   Overall Oral  Motor/Sensory Function Appears within functional limits for tasks assessed   Motor Speech   Overall Motor Speech Impaired   Articulation --   Level of Impairment --   Motor Planning Impaired   Level of Impairment Word   Motor Speech Errors Aware;Consistent   Effective Techniques Slow rate;Over-articulate   Phonation WFL      Pt exhibited dysfluency at word, phrase, sentence and conversation levels in initial and medial consonants and at all positions of sentences/utterances. Her rate of dysfluency was not impacted positively or negatively to a notable degree with rote speech, unison speech with SLP using rote speech (nursery rhymes, pledge of allegiance), or with unison reading.  SLP used diagnostic therapy and pt exhibted a higher frequency fluent speech with greatly reduced speech rate in reading 4-5 word conversational sentences ("How are you?", "I'm ready to leave," etc) and repeating sentences after this SLP. SLP then developed HEP for pt to practice at home.                    SLP Education - 07/22/15 1710    Education provided Yes   Education Details HEP, compensations   Person(s) Educated Patient   Methods Explanation;Demonstration;Verbal cues   Comprehension Verbalized understanding;Returned demonstration;Verbal cues required          SLP Short Term Goals - 07/22/15 1712    SLP SHORT TERM GOAL #1   Title pt will say 15/20 words with fluency compensations  Time 4   Period Weeks   Status New   SLP SHORT TERM GOAL #2   Title pt will read sentences of 7+ words with fluency compensations   Time 4   Period Weeks   Status New          SLP Long Term Goals - 07/22/15 1715    SLP LONG TERM GOAL #1   Title pt will participate in 7 minutes functional conversation using fluency enhancing strategies with rare min A to do so   Time 8   Period Weeks   Status New          Plan - 07/22/15 1711    Clinical Impression Statement Pt presents today with  mod-severe dysfluency, with WNL brain imaging on 16 days post-onset.   Speech Therapy Frequency 2x / week   Duration --  8 weeks   Treatment/Interventions SLP instruction and feedback;Compensatory strategies;Patient/family education;Functional tasks;Cueing hierarchy   Potential to Achieve Goals Good   Potential Considerations Other (comment)  unknown diagnosis for dysfluency   Consulted and Agree with Plan of Care Patient        Problem List There are no active problems to display for this patient.   [facility] HospCHINKE,CARL , MS, CCC-SLP 07/22/2015, 5:17 PM  [facility] [address] [place], [place], [address] Phone: [phone]   Fax:  [phone]  Name: Katrina Livingston MRN: [id number] Date of Birth: 06-05-1981

## 2015-07-22 NOTE — Patient Instructions (Signed)
Practice the sentences I provided to you at least 3 times each, focusing on slowing down, continuous movement, and start with an easy, relaxed breath.

## 2015-08-08 ENCOUNTER — Encounter: Payer: Self-pay | Admitting: Neurology

## 2015-08-08 ENCOUNTER — Ambulatory Visit (INDEPENDENT_AMBULATORY_CARE_PROVIDER_SITE_OTHER): Payer: 59 | Admitting: Neurology

## 2015-08-08 VITALS — BP 140/90 | HR 98 | Ht 67.0 in | Wt 230.1 lb

## 2015-08-08 DIAGNOSIS — F8081 Childhood onset fluency disorder: Secondary | ICD-10-CM | POA: Insufficient documentation

## 2015-08-08 NOTE — Progress Notes (Signed)
Chart forwarded.  

## 2015-08-08 NOTE — Patient Instructions (Signed)
I don't have a neurologic explanation for your stuttering.  Fortunately, your MRI of the brain was normal.  Usually in this situation, it is related to stress.

## 2015-08-08 NOTE — Progress Notes (Signed)
NEUROLOGY CONSULTATION NOTE  Katrina CashMauricia Greening MRN: [id number] DOB: 1980/09/05  Referring provider: Dr. Zachery DauerBarnes Primary care provider: Dr. Zachery DauerBarnes  Reason for consult:  Speech disorder  HISTORY OF PRESENT ILLNESS: Katrina Livingston is a 35 year old right-handed female who presents for second opinion regarding speech abnormality.  She is accompanied by her friend.  History obtained by patient, ED note and speech therapy note.  Labs, report of brain MRI, and imaging of neck CT reviewed.  In late November, she developed sensation that her throat was closing, like she was "hit in the throat".  About three days later, she started to exhibit stuttering.  She denied any focal numbness or weakness, or dysphagia.  She was evaluated in the ED where CT of soft tissue of the neck was unremarkable for any mass lesion.  She was referred to ENT, who found no abnormality on evaluation.  She had an MRI of the brain with and without contrast performed on 07/10/15, which was normal.  [place] in [place], who told her it was "stress".  She underwent SLP evaluation on 07/22/15.  She demonstrated "dysfluency at word, phrase, sentence and conversation levels" and "she exhibited a higher frequency fluent speech with greatly reduced speech rate in reading 4-5 word conversational sentences."  She was prescribed exercises for home.  She has not been able to work since onset of symptoms.  She works for [employer] and is on [employer].  She denies stress or depression.  PAST MEDICAL HISTORY: Past Medical History  Diagnosis Date  . PCOS (polycystic ovarian syndrome)     PAST SURGICAL HISTORY: Past Surgical History  Procedure Laterality Date  . Fracture surgery    . Knee surgery    . Leg surgery      MEDICATIONS: Current Outpatient Prescriptions on File Prior to Visit  Medication Sig Dispense Refill  . albuterol (PROVENTIL HFA;VENTOLIN HFA) 108 (90 BASE) MCG/ACT inhaler Inhale 2  puffs into the lungs every 4 (four) hours as needed for wheezing or shortness of breath (cough). 1 Inhaler 0  . valACYclovir (VALTREX) 500 MG tablet Take 250-500 mg by mouth 2 (two) times daily. Take 1 tablet by mouth twice a day for 5 days, then take 1 tablet every day as needed     No current facility-administered medications on file prior to visit.    ALLERGIES: Allergies  Allergen Reactions  . Adhesive [Tape]     Blisters/burns the skin    FAMILY HISTORY: Family History  Problem Relation Age of Onset  . Cancer Maternal Aunt   . Cancer Paternal Grandmother   . Hypertension Maternal Aunt   . Diabetes Maternal Aunt   . Diabetes Maternal Grandmother   . Hypertension Paternal Grandfather     SOCIAL HISTORY: Social History   Social History  . Marital Status: Single    Spouse Name: N/A  . Number of Children: N/A  . Years of Education: N/A   Occupational History  . Not on file.   Social History Main Topics  . Smoking status: Never Smoker   . Smokeless tobacco: Never Used  . Alcohol Use: Yes     Comment: occasionally   . Drug Use: No  . Sexual Activity: Not on file   Other Topics Concern  . Not on file   Social History Narrative   Lives alone in an apartment on the 3rd floor.  No children.  Works for [employer] and [employer].  Education: 1 year of college.  REVIEW OF SYSTEMS: Constitutional: No fevers, chills, or sweats, no generalized fatigue, change in appetite Eyes: No visual changes, double vision, eye pain Ear, nose and throat: No hearing loss, ear pain, nasal congestion, sore throat Cardiovascular: No chest pain, palpitations Respiratory:  No shortness of breath at rest or with exertion, wheezes GastrointestinaI: No nausea, vomiting, diarrhea, abdominal pain, fecal incontinence Genitourinary:  No dysuria, urinary retention or frequency Musculoskeletal:  No neck pain, back pain Integumentary: No rash, pruritus, skin lesions Neurological: as  above Psychiatric: No depression, insomnia, anxiety Endocrine: No palpitations, fatigue, diaphoresis, mood swings, change in appetite, change in weight, increased thirst Hematologic/Lymphatic:  No anemia, purpura, petechiae. Allergic/Immunologic: no itchy/runny eyes, nasal congestion, recent allergic reactions, rashes  PHYSICAL EXAM: Filed Vitals:   08/08/15 1243  BP: 140/90  Pulse: 98   General: No acute distress.  Patient appears well-groomed.  Head:  Normocephalic/atraumatic Eyes:  fundi unremarkable, without vessel changes, exudates, hemorrhages or papilledema. Neck: supple, no paraspinal tenderness, full range of motion Back: No paraspinal tenderness Heart: regular rate and rhythm Lungs: Clear to auscultation bilaterally. Vascular: No carotid bruits. Neurological Exam: Mental status: alert and oriented to person, place, and time, recent and remote memory intact, fund of knowledge intact, attention and concentration intact, speech dysfluent with periods of fluency and not dysarthric, language intact. Cranial nerves: CN I: not tested CN II: pupils equal, round and reactive to light, visual fields intact, fundi unremarkable, without vessel changes, exudates, hemorrhages or papilledema. CN III, IV, VI:  full range of motion, no nystagmus, no ptosis CN V: facial sensation intact CN VII: upper and lower face symmetric CN VIII: hearing intact CN IX, X: gag intact, uvula midline CN XI: sternocleidomastoid and trapezius muscles intact CN XII: tongue midline Bulk & Tone: normal, no fasciculations. Motor:  5/5 throughout Sensation: temperature and vibration sensation intact. Deep Tendon Reflexes:  2+ throughout, toes downgoing.  Finger to nose testing:  Without dysmetria.  Heel to shin:  Without dysmetria.  Gait:  Normal station and stride.  Able to turn and tandem walk. Romberg negative.  IMPRESSION: Stuttering.  With normal MRI of the brain, this is typically non-organic,  especially since her neurologic exam is otherwise normal.  It may be beneficial to speak with a psychologist to see if there is underlying psychologic etiology.  She may also benefit from continued speech therapy.  Thank you for allowing me to take part in the care of this patient.  Shon Millet, DO  CC:  Juluis Rainier, MD

## 2015-08-16 ENCOUNTER — Telehealth: Payer: Self-pay

## 2015-08-16 NOTE — Telephone Encounter (Signed)
Returned [employer]. Answered all questions to the best of my ability.

## 2015-08-16 NOTE — Telephone Encounter (Signed)
-----   Message from Ander Purpura sent at 08/16/2015 10:12 AM EST ----- Regarding: Please call [place], Kathlene November with [facility] called and needs info on the above patient. She was seen by Dr. Everlena Cooper on the 16th of Jan. And he would like To know if the patient has a disability.  Kathlene November [phone] Thx, Teddi

## 2016-05-01 ENCOUNTER — Other Ambulatory Visit: Payer: Self-pay | Admitting: Family Medicine

## 2016-05-01 DIAGNOSIS — E049 Nontoxic goiter, unspecified: Secondary | ICD-10-CM

## 2016-05-10 ENCOUNTER — Ambulatory Visit
Admission: RE | Admit: 2016-05-10 | Discharge: 2016-05-10 | Disposition: A | Discharge status: Home / Self Care | Payer: 59 | Source: Ambulatory Visit | Attending: Family Medicine | Admitting: Family Medicine

## 2016-05-10 DIAGNOSIS — E049 Nontoxic goiter, unspecified: Secondary | ICD-10-CM

## 2016-07-03 ENCOUNTER — Ambulatory Visit (INDEPENDENT_AMBULATORY_CARE_PROVIDER_SITE_OTHER): Payer: 59 | Admitting: Neurology

## 2016-07-03 ENCOUNTER — Encounter: Payer: Self-pay | Admitting: Neurology

## 2016-07-03 VITALS — BP 131/88 | HR 74 | Resp 20 | Ht 67.0 in | Wt 222.0 lb

## 2016-07-03 DIAGNOSIS — R400 Somnolence: Secondary | ICD-10-CM

## 2016-07-03 DIAGNOSIS — G4733 Obstructive sleep apnea (adult) (pediatric): Secondary | ICD-10-CM | POA: Diagnosis not present

## 2016-07-03 DIAGNOSIS — R519 Headache, unspecified: Secondary | ICD-10-CM

## 2016-07-03 DIAGNOSIS — R51 Headache: Secondary | ICD-10-CM | POA: Diagnosis not present

## 2016-07-03 DIAGNOSIS — G2581 Restless legs syndrome: Secondary | ICD-10-CM | POA: Diagnosis not present

## 2016-07-03 DIAGNOSIS — G4769 Other sleep related movement disorders: Secondary | ICD-10-CM | POA: Diagnosis not present

## 2016-07-03 DIAGNOSIS — E669 Obesity, unspecified: Secondary | ICD-10-CM | POA: Diagnosis not present
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
## 2016-07-03 NOTE — Patient Instructions (Signed)

## 2016-07-26 ENCOUNTER — Telehealth: Payer: Self-pay | Admitting: Neurology

## 2016-07-26 DIAGNOSIS — E669 Obesity, unspecified: Secondary | ICD-10-CM

## 2016-07-26 DIAGNOSIS — G4733 Obstructive sleep apnea (adult) (pediatric): Secondary | ICD-10-CM

## 2016-07-26 DIAGNOSIS — G2581 Restless legs syndrome: Secondary | ICD-10-CM

## 2016-07-26 DIAGNOSIS — R400 Somnolence: Secondary | ICD-10-CM

## 2016-07-26 NOTE — Telephone Encounter (Signed)
[employer] denied Split but approved HST.  Can I get an order for HST? °

## 2016-07-26 NOTE — Telephone Encounter (Signed)
This patient's insurance denied an attended sleep study. I will order home sleep test.  

## 2016-08-07 ENCOUNTER — Ambulatory Visit (INDEPENDENT_AMBULATORY_CARE_PROVIDER_SITE_OTHER): Payer: 59 | Admitting: Neurology

## 2016-08-07 DIAGNOSIS — E669 Obesity, unspecified: Secondary | ICD-10-CM

## 2016-08-07 DIAGNOSIS — G4733 Obstructive sleep apnea (adult) (pediatric): Secondary | ICD-10-CM

## 2016-08-07 DIAGNOSIS — R400 Somnolence: Secondary | ICD-10-CM

## 2016-08-07 DIAGNOSIS — G2581 Restless legs syndrome: Secondary | ICD-10-CM

## 2016-08-14 ENCOUNTER — Telehealth: Payer: Self-pay | Admitting: Neurology

## 2016-08-14 DIAGNOSIS — G4733 Obstructive sleep apnea (adult) (pediatric): Secondary | ICD-10-CM

## 2016-08-14 NOTE — Telephone Encounter (Signed)
Phone note for encounter date 08/07/16.

## 2016-08-14 NOTE — Telephone Encounter (Signed)
[employer] denied CPAP, suggested auto pap

## 2016-08-14 NOTE — Telephone Encounter (Signed)
We will set patient up with autoPAP at home, as insurance denied in house titration study for OSA. Pls process order and notify patient and set up FU in 8-10 weeks.       

## 2016-08-14 NOTE — Procedures (Signed)
  [facility] [address], [address] [place], [place] [id number]  NAME: Katrina Livingston, [place] DOB:03/14/1981 MEDICAL RECORD NUMBERDOS:08/07/16 REFERRING PHYSICIAN: Dr. Lafayette Dragonarr  Study Performed:  HST/Out of Center Sleep Test  HISTORY: 36 year old woman with a  history of polycystic ovary disease, allergic rhinitis, vitamin D deficiency, hypertension, and obesity, who reports snoring and excessive daytime somnolence.  She reports having had a home sleep test a couple of years ago which showed sleep apnea findings. She has not been on treatment for this, was offered CPAP, but could not afford it at the time. Some 10 years ago, she had a PSG and had OSA, but no treatment was initiated at the time. Epworth sleepiness score is 22/24.  STUDY RESULTS:  Total Recording Time: 5h 20 m   Total Apnea/Hypopnea Index (AHI): 13.1  Average Oxygen Saturation: 94%  Lowest Oxygen Saturation: 82%  Average Mean Heart Rate: 76 bmp  IMPRESSION:   Obstructive Sleep Apnea (OSA)    RECOMMENDATION: This home sleep test demonstrates overall mild obstructive sleep apnea with a total AHI of 13.1/hour and O2 nadir of 82%. Given the patient's medical history and sleep related complaints, treatment with positive airway pressure (in the form of CPAP) is recommended. This will require a full night CPAP titration study for proper treatment settings and mask fitting. The patient and his referring provider will be notified of the test results. The patient [facility].  I certify that I have reviewed the raw data recording prior to the issuance of this report in accordance with the standards of Accreditation of the American Academy of Sleep medicine (AASM).  Huston FoleySaima Keidy Thurgood, MD, PhD [facility]) Diplomat, ABPN (neurology and sleep)

## 2016-08-14 NOTE — Progress Notes (Signed)
Patient referred by Dr. Lafayette Dragonarr, seen by me on 07/03/16, HST on 08/07/16:  Please call and notify the patient that the recent home sleep test did suggest the diagnosis of mild obstructive sleep apnea and that I recommend treatment for this in the form of CPAP. I will request an overnight sleep study for proper titration and mask fitting. Please explain to patient and arrange for a CPAP titration study. I have placed an order in the chart. Thanks, and [facility].   Huston FoleySaima Joash Tony, MD, PhD [facility])

## 2016-08-16 ENCOUNTER — Telehealth: Payer: Self-pay

## 2016-08-16 NOTE — Telephone Encounter (Signed)
While I was giving sleep study results and recommendations, patient had several questions about the approval process for the titration study. Please call patient to explain when and why titration study was denied.  Thank you

## 2016-08-16 NOTE — Telephone Encounter (Signed)
See other phone note

## 2016-08-16 NOTE — Telephone Encounter (Signed)
-----   Message from Huston FoleySaima Athar, MD sent at 08/14/2016  8:13 AM EST ----- Patient referred by Dr. Lafayette Dragonarr, seen by me on 07/03/16, HST on 08/07/16:  Please call and notify the patient that the recent home sleep test did suggest the diagnosis of mild obstructive sleep apnea and that I recommend treatment for this in the form of CPAP. I will request an overnight sleep study for proper titration and mask fitting. Please explain to patient and arrange for a CPAP titration study. I have placed an order in the chart. Thanks, and [facility].   Huston FoleySaima Athar, MD, PhD [facility])

## 2016-08-16 NOTE — Telephone Encounter (Signed)
I spoke to patient and gave her results and recommendations. Patient is hesitant about starting Auto-PAP. She requested a f/u appt with Dr. Frances FurbishAthar to discuss further.

## 2016-10-17 ENCOUNTER — Encounter: Payer: Self-pay | Admitting: Neurology

## 2016-10-17 ENCOUNTER — Ambulatory Visit (INDEPENDENT_AMBULATORY_CARE_PROVIDER_SITE_OTHER): Payer: 59 | Admitting: Neurology

## 2016-10-17 VITALS — BP 132/76 | HR 80 | Resp 16 | Ht 67.0 in | Wt 217.0 lb

## 2016-10-17 DIAGNOSIS — G4733 Obstructive sleep apnea (adult) (pediatric): Secondary | ICD-10-CM
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
## 2016-10-17 NOTE — Patient Instructions (Signed)
We will set you up at home with a so called autoPAP machine for treatment of your obstructive sleep apnea.  I will see you back for your first visit on autoPAP in about 3 months

## 2016-11-23 ENCOUNTER — Observation Stay (HOSPITAL_COMMUNITY)
Admission: EM | Admit: 2016-11-23 | Discharge: 2016-11-25 | Disposition: A | Discharge status: Home / Self Care | Payer: 59 | Attending: Surgery | Admitting: Surgery

## 2016-11-23 ENCOUNTER — Encounter (HOSPITAL_COMMUNITY): Payer: Self-pay | Admitting: Emergency Medicine

## 2016-11-23 DIAGNOSIS — K353 Acute appendicitis with localized peritonitis, without perforation or gangrene: Secondary | ICD-10-CM

## 2016-11-23 DIAGNOSIS — F8081 Childhood onset fluency disorder: Secondary | ICD-10-CM | POA: Insufficient documentation

## 2016-11-23 DIAGNOSIS — K5900 Constipation, unspecified: Secondary | ICD-10-CM | POA: Insufficient documentation

## 2016-11-23 DIAGNOSIS — E559 Vitamin D deficiency, unspecified: Secondary | ICD-10-CM | POA: Insufficient documentation

## 2016-11-23 DIAGNOSIS — Z91048 Other nonmedicinal substance allergy status: Secondary | ICD-10-CM | POA: Insufficient documentation

## 2016-11-23 DIAGNOSIS — Z79899 Other long term (current) drug therapy: Secondary | ICD-10-CM | POA: Diagnosis not present

## 2016-11-23 DIAGNOSIS — Z7951 Long term (current) use of inhaled steroids: Secondary | ICD-10-CM | POA: Insufficient documentation

## 2016-11-23 DIAGNOSIS — Z9889 Other specified postprocedural states: Secondary | ICD-10-CM | POA: Insufficient documentation

## 2016-11-23 DIAGNOSIS — E282 Polycystic ovarian syndrome: Secondary | ICD-10-CM | POA: Diagnosis not present

## 2016-11-23 DIAGNOSIS — Z8249 Family history of ischemic heart disease and other diseases of the circulatory system: Secondary | ICD-10-CM | POA: Diagnosis not present

## 2016-11-23 DIAGNOSIS — K3580 Unspecified acute appendicitis: Secondary | ICD-10-CM | POA: Diagnosis present

## 2016-11-23 DIAGNOSIS — K449 Diaphragmatic hernia without obstruction or gangrene: Secondary | ICD-10-CM | POA: Insufficient documentation

## 2016-11-23 DIAGNOSIS — K37 Unspecified appendicitis: Secondary | ICD-10-CM | POA: Diagnosis present

## 2016-11-23 DIAGNOSIS — Z833 Family history of diabetes mellitus: Secondary | ICD-10-CM | POA: Diagnosis not present

## 2016-11-23 LAB — CBC
HCT: 44 % (ref 36.0–46.0)
HEMOGLOBIN: 15.4 g/dL — AB (ref 12.0–15.0)
MCH: 30.6 pg (ref 26.0–34.0)
MCHC: 35 g/dL (ref 30.0–36.0)
MCV: 87.3 fL (ref 78.0–100.0)
PLATELETS: 384 10*3/uL (ref 150–400)
RBC: 5.04 MIL/uL (ref 3.87–5.11)
RDW: 12.4 % (ref 11.5–15.5)
WBC: 8.4 10*3/uL (ref 4.0–10.5)

## 2016-11-23 LAB — COMPREHENSIVE METABOLIC PANEL
ALBUMIN: 4.3 g/dL (ref 3.5–5.0)
ALK PHOS: 67 U/L (ref 38–126)
ALT: 16 U/L (ref 14–54)
AST: 18 U/L (ref 15–41)
Anion gap: 7 (ref 5–15)
BUN: 15 mg/dL (ref 6–20)
CALCIUM: 9.4 mg/dL (ref 8.9–10.3)
CO2: 28 mmol/L (ref 22–32)
CREATININE: 1.01 mg/dL — AB (ref 0.44–1.00)
Chloride: 100 mmol/L — ABNORMAL LOW (ref 101–111)
GFR calc Af Amer: >60 mL/min (ref >60)
GFR calc non Af Amer: >60 mL/min (ref >60)
GLUCOSE: 110 mg/dL — AB (ref 65–99)
Potassium: 3.9 mmol/L (ref 3.5–5.1)
SODIUM: 135 mmol/L (ref 135–145)
TOTAL PROTEIN: 8.2 g/dL — AB (ref 6.5–8.1)
Total Bilirubin: 0.2 mg/dL — ABNORMAL LOW (ref 0.3–1.2)

## 2016-11-23 LAB — POC URINE PREG, ED: PREG TEST UR: NEGATIVE

## 2016-11-23 LAB — LIPASE, BLOOD: Lipase: 31 U/L (ref 11–51)

## 2016-11-23 MED ORDER — ONDANSETRON HCL 4 MG/2ML IJ SOLN
4.0000 mg | Freq: Once | INTRAMUSCULAR | Status: AC
  Administered 2016-11-24: 4 mg via INTRAVENOUS
  Filled 2016-11-23: qty 2

## 2016-11-23 MED ORDER — MORPHINE SULFATE (PF) 4 MG/ML IV SOLN
4.0000 mg | Freq: Once | INTRAVENOUS | Status: AC
  Administered 2016-11-24: 4 mg via INTRAVENOUS
  Filled 2016-11-23: qty 1

## 2016-11-23 NOTE — ED Triage Notes (Signed)
Patient c/o sharp pain in lower  right side. [facility] and [facility] for [facility] evaluation. Pt states she began jogging 2 weeks ago and noticed that's when the pain started. States pain is worse when taking deep breaths.

## 2016-11-23 NOTE — ED Provider Notes (Signed)
[facility]-EMERGENCY DEPT Provider Note   CSN: [id number] Arrival date & time: 11/23/16  1828  By signing my name below, I, Ny'Kea Lewis, attest that this documentation has been prepared under the direction and in the presence of Roxy Horseman, PA-C. Electronically Signed: Karren Cobble, ED Scribe. 11/24/16. 12:34 AM.'   History   Chief Complaint Chief Complaint  Patient presents with  . Abdominal Pain   The history is provided by the patient. No language interpreter was used.  Abdominal Pain   The current episode started more than 1 week ago. The problem occurs constantly. The problem has been gradually worsening. Pertinent negatives include fever and vomiting.    HPI Comments: Katrina Livingston is a 36 y.o. female with a PMHx of PCOS, migraines, and vitamin d deficiency, who presents to the Emergency Department complaining of gradually worsening lower right quadrant sharp pain that started two weeks ago but has become more persistent within the last two days. Her associated symptoms include constipation. She has had mild relief with Dulcolax. She notes during the initial onset of her pain she was exercising when she noticed sudden sharped RLQ pain. She notes the pain is excerebrated with exertion and aspiration. No other treatment tried PTA. She denies fever, emesis, nausea, vaginal discharge, vaginal bleeding or any abdominal PSHx.    Past Medical History:  Diagnosis Date  . Amenorrhea   . Fatigue   . Migraine   . PCOS (polycystic ovarian syndrome)   . Vitamin D deficiency     Patient Active Problem List   Diagnosis Date Noted  . Stutter 08/08/2015    Past Surgical History:  Procedure Laterality Date  . FEMUR FRACTURE SURGERY    . FRACTURE SURGERY    . KNEE SURGERY    . LEG SURGERY      OB History    No data available       Home Medications    Prior to Admission medications   Medication Sig Start Date End Date Taking? Authorizing Provider  albuterol (PROVENTIL  HFA;VENTOLIN HFA) 108 (90 BASE) MCG/ACT inhaler Inhale 2 puffs into the lungs every 4 (four) hours as needed for wheezing or shortness of breath (cough). 06/29/15   Charm Rings, MD  fluticasone (FLONASE) 50 MCG/ACT nasal spray Place into both nostrils daily.    [provider]  folic acid (FOLVITE) 1 MG tablet Take 1 mg by mouth daily.    [provider]  modafinil (PROVIGIL) 200 MG tablet Take 200 mg by mouth daily.    [provider]  spironolactone (ALDACTONE) 50 MG tablet Take 50 mg by mouth daily.    [provider]  valACYclovir (VALTREX) 500 MG tablet Take 250-500 mg by mouth 2 (two) times daily. Take 1 tablet by mouth twice a day for 5 days, then take 1 tablet every day as needed    [provider]  Vitamin D, Ergocalciferol, (DRISDOL) 50000 units CAPS capsule TAKE ONE CAPSULE BY MOUTH ONCE A WEEK FOR 4 MONTHS 07/21/15   [provider]    Family History Family History  Problem Relation Age of Onset  . Cancer Maternal Aunt   . Cancer Paternal Grandmother   . Hypertension Maternal Aunt   . Diabetes Maternal Aunt   . Diabetes Maternal Grandmother   . Hypertension Paternal Grandfather     Social History Social History  Substance Use Topics  . Smoking status: Never Smoker  . Smokeless tobacco: Never Used  . Alcohol use Yes  Comment: occasionally      Allergies   Adhesive [tape]   Review of Systems Review of Systems  Constitutional: Negative for fever.  Gastrointestinal: Positive for abdominal pain. Negative for vomiting.  Genitourinary: Negative for vaginal bleeding and vaginal discharge.  All other systems reviewed and are negative.    Physical Exam Updated Vital Signs BP 130/79 (BP Location: Left Arm)   Pulse 86   Temp 98.6 F (37 C) (Oral)   Resp 18   Ht 5\' 7"  (1.702 m)   Wt 212 lb (96.2 kg)   SpO2 98%   BMI 33.20 kg/m   Physical Exam  Constitutional: She is oriented to person, place, and time.  She appears well-developed and well-nourished.  HENT:  Head: Normocephalic and atraumatic.  Cardiovascular: Normal rate and regular rhythm.   No murmur heard. Pulmonary/Chest: Effort normal and breath sounds normal. No respiratory distress.  Abdominal: Soft. There is no tenderness. There is no rebound and no guarding.  RLQ tender to palpation.   Musculoskeletal: She exhibits no edema or tenderness.  Neurological: She is alert and oriented to person, place, and time.  Skin: Skin is warm and dry.  Psychiatric: She has a normal mood and affect. Her behavior is normal.  Nursing note and vitals reviewed.    ED Treatments / Results   DIAGNOSTIC STUDIES: Oxygen Saturation is 98% on RA, noraml by my interpretation.   COORDINATION OF CARE: 11:49 PM-Discussed next steps with pt. Pt verbalized understanding and is agreeable with the plan.    Labs (all labs ordered are listed, but only abnormal results are displayed) Labs Reviewed  COMPREHENSIVE METABOLIC PANEL - Abnormal; Notable for the following:       Result Value   Chloride 100 (*)    Glucose, Bld 110 (*)    Creatinine, Ser 1.01 (*)    Total Protein 8.2 (*)    Total Bilirubin 0.2 (*)    All other components within normal limits  CBC - Abnormal; Notable for the following:    Hemoglobin 15.4 (*)    All other components within normal limits  URINALYSIS, ROUTINE W REFLEX MICROSCOPIC - Abnormal; Notable for the following:    Hgb urine dipstick MODERATE (*)    Bacteria, UA RARE (*)    Squamous Epithelial / LPF 0-5 (*)    All other components within normal limits  LIPASE, BLOOD  POC URINE PREG, ED    EKG  EKG Interpretation None       Radiology Ct Abdomen Pelvis W Contrast  Result Date: 11/24/2016 CLINICAL DATA:  Right lower quadrant pain. EXAM: CT ABDOMEN AND PELVIS WITH CONTRAST TECHNIQUE: Multidetector CT imaging of the abdomen and pelvis was performed using the standard protocol following bolus administration of  intravenous contrast. CONTRAST:  100mL ISOVUE-300 IOPAMIDOL (ISOVUE-300) INJECTION 61% COMPARISON:  None. FINDINGS: Lower chest: No acute abnormality. Hepatobiliary: No focal liver abnormality is seen. No gallstones, gallbladder wall thickening, or biliary dilatation. Pancreas: Unremarkable. No pancreatic ductal dilatation or surrounding inflammatory changes. Spleen: Normal in size without focal abnormality. Adrenals/Urinary Tract: Adrenal glands are unremarkable. Kidneys are normal, without renal calculi, focal lesion, or hydronephrosis. Bladder is unremarkable. Stomach/Bowel: Small hiatal hernia. Stomach and small bowel are normal. The appendix is abnormal. There is enlargement and low-density at the tip, possibly a mucocele measuring about 13 mm. In addition, there is mural thickening, mural enhancement and periappendiceal inflammation. Findings are consistent with appendicitis. Colon is unremarkable. Vascular/Lymphatic: No significant vascular findings are present. No enlarged abdominal or  pelvic lymph nodes. Reproductive: Uterus and bilateral adnexa are unremarkable. Other: No ascites.  No extraluminal gas P Musculoskeletal: No significant skeletal lesion. IMPRESSION: Acute appendicitis. The inflammation is predominantly in the appendiceal tip. There also is an abnormal appearance of the appendiceal tip suggesting that there may have been a pre-existing mucocele. These results were called by telephone at the time of interpretation on 11/24/2016 at 12:52 am to Dr. Roxy Horseman , who verbally acknowledged these results. Electronically Signed   By: Ellery Plunk M.D.   On: 11/24/2016 00:55    Procedures Procedures (including critical care time)  Medications Ordered in ED Medications - No data to display   Initial Impression / Assessment and Plan / ED Course  I have reviewed the triage vital signs and the nursing notes.  Pertinent labs & imaging results that were available during my care of the  patient were reviewed by me and considered in my medical decision making (see chart for details).     Patient with right lower quadrant tenderness 2 days. No leukocytosis. Vital signs stable. Patient is significantly tender to palpation. Will check CT abdomen pelvis to rule out appendicitis.  CT is consistent with acute appendicitis. She has no evidence of abscess. She feels comfortable after pain medicine. I discussed case with Dr. Gerrit Friends, who recommends placing temporary admission orders and will see the patient first thing in the morning for appendectomy. Patient is to be kept nothing by mouth. Antibiotics will be started with when necessary pain medicine and maintenance fluids. Patient understands and agrees to plan.  Final Clinical Impressions(s) / ED Diagnoses   Final diagnoses:  Acute appendicitis with localized peritonitis    New Prescriptions New Prescriptions   No medications on file   I personally performed the services described in this documentation, which was scribed in my presence. The recorded information has been reviewed and is accurate.       Roxy Horseman, PA-C 11/24/16 0119    Zadie Rhine, MD 11/24/16 [phone]

## 2016-11-24 ENCOUNTER — Encounter (HOSPITAL_COMMUNITY): Payer: Self-pay | Admitting: Radiology

## 2016-11-24 ENCOUNTER — Observation Stay (HOSPITAL_COMMUNITY): Payer: 59 | Admitting: Anesthesiology

## 2016-11-24 ENCOUNTER — Emergency Department (HOSPITAL_COMMUNITY): Payer: 59

## 2016-11-24 ENCOUNTER — Encounter (HOSPITAL_COMMUNITY)
Admission: EM | Disposition: A | Discharge status: Home / Self Care | Payer: Self-pay | Source: Home / Self Care | Attending: Emergency Medicine

## 2016-11-24 DIAGNOSIS — K37 Unspecified appendicitis: Secondary | ICD-10-CM | POA: Diagnosis present

## 2016-11-24 DIAGNOSIS — K3580 Unspecified acute appendicitis: Secondary | ICD-10-CM | POA: Diagnosis present

## 2016-11-24 HISTORY — PX: LAPAROSCOPIC APPENDECTOMY: SHX408

## 2016-11-24 LAB — URINALYSIS, ROUTINE W REFLEX MICROSCOPIC
Bilirubin Urine: NEGATIVE
Glucose, UA: NEGATIVE mg/dL
KETONES UR: NEGATIVE mg/dL
Leukocytes, UA: NEGATIVE
Nitrite: NEGATIVE
PROTEIN: NEGATIVE mg/dL
Specific Gravity, Urine: 1.021 (ref 1.005–1.030)
pH: 5 (ref 5.0–8.0)

## 2016-11-24 LAB — MRSA PCR SCREENING: MRSA by PCR: NEGATIVE

## 2016-11-24 SURGERY — APPENDECTOMY, LAPAROSCOPIC
Anesthesia: General | Site: Abdomen

## 2016-11-24 MED ORDER — PROPOFOL 10 MG/ML IV BOLUS
INTRAVENOUS | Status: DC | PRN
  Administered 2016-11-24: 170 mg via INTRAVENOUS

## 2016-11-24 MED ORDER — SUGAMMADEX SODIUM 200 MG/2ML IV SOLN
INTRAVENOUS | Status: DC | PRN
  Administered 2016-11-24: 200 mg via INTRAVENOUS

## 2016-11-24 MED ORDER — BUPIVACAINE HCL (PF) 0.25 % IJ SOLN
INTRAMUSCULAR | Status: DC | PRN
  Administered 2016-11-24: 20 mL

## 2016-11-24 MED ORDER — LACTATED RINGERS IV SOLN
INTRAVENOUS | Status: DC | PRN
  Administered 2016-11-24: 11:00:00 via INTRAVENOUS

## 2016-11-24 MED ORDER — LIDOCAINE 2% (20 MG/ML) 5 ML SYRINGE
INTRAMUSCULAR | Status: DC | PRN
  Administered 2016-11-24: 50 mg via INTRAVENOUS

## 2016-11-24 MED ORDER — HYDROMORPHONE HCL 1 MG/ML IJ SOLN
0.2500 mg | INTRAMUSCULAR | Status: DC | PRN
  Administered 2016-11-24 (×2): 0.25 mg via INTRAVENOUS

## 2016-11-24 MED ORDER — IOPAMIDOL (ISOVUE-300) INJECTION 61%
100.0000 mL | Freq: Once | INTRAVENOUS | Status: AC | PRN
Start: 2016-11-24 — End: 2016-11-24
  Administered 2016-11-24: 100 mL via INTRAVENOUS

## 2016-11-24 MED ORDER — ALBUTEROL SULFATE (2.5 MG/3ML) 0.083% IN NEBU: 2.5000 mg | INHALATION_SOLUTION | RESPIRATORY_TRACT | Status: DC | PRN

## 2016-11-24 MED ORDER — HYDROCODONE-ACETAMINOPHEN 5-325 MG PO TABS
1.0000 | ORAL_TABLET | ORAL | Status: DC | PRN
  Administered 2016-11-24 – 2016-11-25 (×3): 2 via ORAL
  Filled 2016-11-24 (×3): qty 2

## 2016-11-24 MED ORDER — HYDROMORPHONE HCL 1 MG/ML IJ SOLN: 1.0000 mg | INTRAMUSCULAR | Status: DC | PRN

## 2016-11-24 MED ORDER — ONDANSETRON HCL 4 MG/2ML IJ SOLN
4.0000 mg | Freq: Four times a day (QID) | INTRAMUSCULAR | Status: AC | PRN
  Administered 2016-11-24: 4 mg via INTRAVENOUS

## 2016-11-24 MED ORDER — ROCURONIUM BROMIDE 10 MG/ML (PF) SYRINGE
PREFILLED_SYRINGE | INTRAVENOUS | Status: DC | PRN
  Administered 2016-11-24: 50 mg via INTRAVENOUS

## 2016-11-24 MED ORDER — ACETAMINOPHEN 650 MG RE SUPP: 650.0000 mg | Freq: Four times a day (QID) | RECTAL | Status: DC | PRN

## 2016-11-24 MED ORDER — ONDANSETRON HCL 4 MG/2ML IJ SOLN
4.0000 mg | Freq: Four times a day (QID) | INTRAMUSCULAR | Status: DC | PRN
  Administered 2016-11-24: 4 mg via INTRAVENOUS
  Filled 2016-11-24: qty 2

## 2016-11-24 MED ORDER — PROPOFOL 10 MG/ML IV BOLUS
INTRAVENOUS | Status: AC
  Filled 2016-11-24: qty 20

## 2016-11-24 MED ORDER — ACETAMINOPHEN 325 MG PO TABS: 650.0000 mg | ORAL_TABLET | Freq: Four times a day (QID) | ORAL | Status: DC | PRN

## 2016-11-24 MED ORDER — METRONIDAZOLE IN NACL 5-0.79 MG/ML-% IV SOLN
500.0000 mg | Freq: Three times a day (TID) | INTRAVENOUS | Status: DC
  Administered 2016-11-24: 500 mg via INTRAVENOUS
  Filled 2016-11-24: qty 100

## 2016-11-24 MED ORDER — ONDANSETRON 4 MG PO TBDP: 4.0000 mg | ORAL_TABLET | Freq: Four times a day (QID) | ORAL | Status: DC | PRN

## 2016-11-24 MED ORDER — SODIUM CHLORIDE 0.9 % IV SOLN: INTRAVENOUS | Status: DC

## 2016-11-24 MED ORDER — DEXAMETHASONE SODIUM PHOSPHATE 10 MG/ML IJ SOLN
INTRAMUSCULAR | Status: DC | PRN
  Administered 2016-11-24: 10 mg via INTRAVENOUS

## 2016-11-24 MED ORDER — LACTATED RINGERS IV SOLN: INTRAVENOUS | Status: DC

## 2016-11-24 MED ORDER — OXYCODONE HCL 5 MG PO TABS: 5.0000 mg | ORAL_TABLET | Freq: Once | ORAL | Status: DC | PRN

## 2016-11-24 MED ORDER — ALBUTEROL SULFATE HFA 108 (90 BASE) MCG/ACT IN AERS: 2.0000 | INHALATION_SPRAY | RESPIRATORY_TRACT | Status: DC | PRN

## 2016-11-24 MED ORDER — 0.9 % SODIUM CHLORIDE (POUR BTL) OPTIME
TOPICAL | Status: DC | PRN
Start: 2016-11-24 — End: 2016-11-24
  Administered 2016-11-24: 1000 mL

## 2016-11-24 MED ORDER — BUPIVACAINE HCL (PF) 0.25 % IJ SOLN
INTRAMUSCULAR | Status: AC
  Filled 2016-11-24: qty 30

## 2016-11-24 MED ORDER — MIDAZOLAM HCL 2 MG/2ML IJ SOLN
INTRAMUSCULAR | Status: AC
Start: 2016-11-24 — End: 2016-11-24
  Filled 2016-11-24: qty 2

## 2016-11-24 MED ORDER — LACTATED RINGERS IR SOLN
Status: DC | PRN
  Administered 2016-11-24: 1000 mL

## 2016-11-24 MED ORDER — SPIRONOLACTONE 25 MG PO TABS
50.0000 mg | ORAL_TABLET | Freq: Every evening | ORAL | Status: DC
  Administered 2016-11-24: 50 mg via ORAL
  Filled 2016-11-24: qty 2

## 2016-11-24 MED ORDER — FENTANYL CITRATE (PF) 250 MCG/5ML IJ SOLN
INTRAMUSCULAR | Status: AC
  Filled 2016-11-24: qty 5

## 2016-11-24 MED ORDER — MIDAZOLAM HCL 5 MG/5ML IJ SOLN
INTRAMUSCULAR | Status: DC | PRN
  Administered 2016-11-24: 2 mg via INTRAVENOUS

## 2016-11-24 MED ORDER — OXYCODONE HCL 5 MG/5ML PO SOLN
5.0000 mg | Freq: Once | ORAL | Status: DC | PRN
  Filled 2016-11-24: qty 5

## 2016-11-24 MED ORDER — HYDROMORPHONE HCL 1 MG/ML IJ SOLN
INTRAMUSCULAR | Status: AC
  Filled 2016-11-24: qty 0.5

## 2016-11-24 MED ORDER — DEXTROSE 5 % IV SOLN
2.0000 g | INTRAVENOUS | Status: DC
  Administered 2016-11-24: 2 g via INTRAVENOUS
  Filled 2016-11-24: qty 2

## 2016-11-24 MED ORDER — ONDANSETRON HCL 4 MG/2ML IJ SOLN: 4.0000 mg | Freq: Three times a day (TID) | INTRAMUSCULAR | Status: DC | PRN

## 2016-11-24 MED ORDER — FENTANYL CITRATE (PF) 100 MCG/2ML IJ SOLN
INTRAMUSCULAR | Status: DC | PRN
  Administered 2016-11-24: 50 ug via INTRAVENOUS
  Administered 2016-11-24: 100 ug via INTRAVENOUS
  Administered 2016-11-24 (×2): 50 ug via INTRAVENOUS

## 2016-11-24 MED ORDER — KCL IN DEXTROSE-NACL 20-5-0.45 MEQ/L-%-% IV SOLN
INTRAVENOUS | Status: DC
Start: 2016-11-24 — End: 2016-11-25
  Filled 2016-11-24: qty 1000

## 2016-11-24 MED ORDER — CEFTRIAXONE SODIUM 2 G IJ SOLR
2.0000 g | Freq: Once | INTRAMUSCULAR | Status: AC
  Administered 2016-11-24: 2 g via INTRAVENOUS
  Filled 2016-11-24: qty 2

## 2016-11-24 MED ORDER — METRONIDAZOLE IN NACL 5-0.79 MG/ML-% IV SOLN
500.0000 mg | Freq: Once | INTRAVENOUS | Status: AC
  Administered 2016-11-24: 500 mg via INTRAVENOUS
  Filled 2016-11-24: qty 100

## 2016-11-24 MED ORDER — KCL IN DEXTROSE-NACL 20-5-0.45 MEQ/L-%-% IV SOLN
INTRAVENOUS | Status: DC
Start: 2016-11-24 — End: 2016-11-24
  Administered 2016-11-24: 09:00:00 via INTRAVENOUS
  Filled 2016-11-24: qty 1000

## 2016-11-24 MED ORDER — IOPAMIDOL (ISOVUE-300) INJECTION 61%
INTRAVENOUS | Status: AC
  Administered 2016-11-24: 01:00:00
  Filled 2016-11-24: qty 100

## 2016-11-24 MED ORDER — PHENYLEPHRINE 40 MCG/ML (10ML) SYRINGE FOR IV PUSH (FOR BLOOD PRESSURE SUPPORT)
PREFILLED_SYRINGE | INTRAVENOUS | Status: DC | PRN
  Administered 2016-11-24: 80 ug via INTRAVENOUS

## 2016-11-24 SURGICAL SUPPLY — 32 items
APPLIER CLIP ROT 10 11.4 M/L (STAPLE)
CHLORAPREP W/TINT 26ML (MISCELLANEOUS) ×2 IMPLANT
CLIP APPLIE ROT 10 11.4 M/L (STAPLE) IMPLANT
CLOSURE STERI-STRIP 1/4X4 (GAUZE/BANDAGES/DRESSINGS) ×2 IMPLANT
COVER SURGICAL LIGHT HANDLE (MISCELLANEOUS) ×2 IMPLANT
CUTTER FLEX LINEAR 45M (STAPLE) ×2 IMPLANT
DECANTER SPIKE VIAL GLASS SM (MISCELLANEOUS) ×2 IMPLANT
DRAPE LAPAROSCOPIC ABDOMINAL (DRAPES) ×2 IMPLANT
ELECT REM PT RETURN 15FT ADLT (MISCELLANEOUS) ×2 IMPLANT
ENDOLOOP SUT PDS II  0 18 (SUTURE)
ENDOLOOP SUT PDS II 0 18 (SUTURE) IMPLANT
GAUZE SPONGE 2X2 8PLY STRL LF (GAUZE/BANDAGES/DRESSINGS) ×1 IMPLANT
GLOVE SURG ORTHO 8.0 STRL STRW (GLOVE) ×2 IMPLANT
GOWN STRL REUS W/TWL XL LVL3 (GOWN DISPOSABLE) ×4 IMPLANT
IRRIG SUCT STRYKERFLOW 2 WTIP (MISCELLANEOUS) ×2
IRRIGATION SUCT STRKRFLW 2 WTP (MISCELLANEOUS) ×1 IMPLANT
KIT BASIN OR (CUSTOM PROCEDURE TRAY) ×2 IMPLANT
POUCH SPECIMEN RETRIEVAL 10MM (ENDOMECHANICALS) ×2 IMPLANT
RELOAD 45 VASCULAR/THIN (ENDOMECHANICALS) IMPLANT
RELOAD STAPLE TA45 3.5 REG BLU (ENDOMECHANICALS) ×2 IMPLANT
SHEARS HARMONIC ACE PLUS 36CM (ENDOMECHANICALS) ×2 IMPLANT
SPONGE GAUZE 2X2 STER 10/PKG (GAUZE/BANDAGES/DRESSINGS) ×1
STRIP CLOSURE SKIN 1/2X4 (GAUZE/BANDAGES/DRESSINGS) ×2 IMPLANT
SUT MNCRL AB 4-0 PS2 18 (SUTURE) ×2 IMPLANT
TAPE CLOTH SURG 4X10 WHT LF (GAUZE/BANDAGES/DRESSINGS) ×2 IMPLANT
TOWEL OR 17X26 10 PK STRL BLUE (TOWEL DISPOSABLE) ×2 IMPLANT
TOWEL OR NON WOVEN STRL DISP B (DISPOSABLE) ×2 IMPLANT
TRAY FOLEY W/METER SILVER 14FR (SET/KITS/TRAYS/PACK) ×2 IMPLANT
TRAY FOLEY W/METER SILVER 16FR (SET/KITS/TRAYS/PACK) IMPLANT
TRAY LAPAROSCOPIC (CUSTOM PROCEDURE TRAY) ×2 IMPLANT
TROCAR XCEL BLUNT TIP 100MML (ENDOMECHANICALS) ×2 IMPLANT
TROCAR XCEL NON-BLD 11X100MML (ENDOMECHANICALS) ×2 IMPLANT

## 2016-11-24 NOTE — Op Note (Signed)
OPERATIVE REPORT - LAPAROSCOPIC APPENDECTOMY  Preop diagnosis: Acute appendicitis  Postop diagnosis: Same  Procedure: Laparoscopic appendectomy  Surgeon:  Velora Hecklerodd M. Loida Calamia, MD, FACS  Anesthesia: General endotracheal  Estimated blood loss: Minimal  Preparation: Chlora-prep  Complications: None  Indications:  patient is a 36 yo BF with two week hx of abdominal pain on the right side of the abdomen localizing to the RLQ.  Pain has become more severe over the last 2-3 days.  Mild constipation, took laxative.  Denies fever or chills.  No prior abd surgery.  Presented to ER.  WBC normal.  CTA positive for acute appendicitis.  Procedure:  Patient is brought to the operating room and placed in a supine position on the operating room table. Following administration of general anesthesia, a time out was held and the patient's name and procedure is confirmed. Patient is then prepped and draped in the usual strict aseptic fashion.  After ascertaining that an adequate level of anesthesia has been achieved, a peri-umbilical incision is made with a #15 blade. Dissection is carried down to the fascia. Fascia is incised in the midline and the peritoneal cavity is entered cautiously. A #0-vicryl pursestring suture is placed in the fascia. An Hassan cannula is introduced under direct vision and secured with the pursestring suture. The abdomen is insufflated with carbon dioxide. The laparoscope is introduced and the abdomen is explored. Operative ports are placed in the right upper quadrant and left lower quadrant. The appendix is identified. The mesoappendix is divided with the harmonic scalpel. Dissection is carried down to the base of the appendix. The base of the appendix is dissected out clearing the junction with the cecal wall. Using an Endo-GIA stapler, the base of the appendix is transected at the junction with the cecal wall. There is good approximation of tissue along the staple line. There is good  hemostasis along the staple line. The appendix is placed into an endo-catch bag and withdrawn through the umbilical port. The #0-vicryl pursestring suture is tied securely.  Right lower quadrant is irrigated with warm saline which is evacuated. Good hemostasis is noted. Ports are removed under direct vision. Good hemostasis is noted at the port sites. Pneumoperitoneum is released.  Skin incisions are anesthetized with local anesthetic. Wounds are closed with interrupted 4-0 Monocryl subcuticular sutures. Wounds are washed and dried and benzoin and Steri-Strips are applied. Dressings are applied. The patient is awakened from anesthesia and brought to the recovery room. The patient tolerated the procedure well.  Velora Hecklerodd M. Dorethy Tomey, MD, [facility] Office: [phone]

## 2016-11-24 NOTE — Anesthesia Procedure Notes (Addendum)
Procedure Name: Intubation Date/Time: 11/24/2016 11:15 AM Performed by: Anne Fu Pre-anesthesia Checklist: Patient identified, Emergency Drugs available, Suction available, Patient being monitored and Timeout performed Patient Re-evaluated:Patient Re-evaluated prior to inductionOxygen Delivery Method: Circle system utilized Preoxygenation: Pre-oxygenation with 100% oxygen Intubation Type: IV induction Ventilation: Mask ventilation without difficulty Laryngoscope Size: Mac and 4 Grade View: Grade I Tube type: Oral Tube size: 7.0 mm Number of attempts: 1 Airway Equipment and Method: Stylet Placement Confirmation: ETT inserted through vocal cords under direct vision,  positive ETCO2,  CO2 detector and breath sounds checked- equal and bilateral Secured at: 19 cm Tube secured with: Tape Dental Injury: Teeth and Oropharynx as per pre-operative assessment

## 2016-11-24 NOTE — H&P (Signed)
Katrina Livingston is an 36 y.o. female.    General Surgery [facility]  Chief Complaint: abdominal pain, acute appendicitis  HPI: patient is a 36 yo BF with two week hx of abdominal pain on the right side of the abdomen localizing to the RLQ.  Pain has become more severe over the last 2-3 days.  Mild constipation, took laxative.  Denies fever or chills.  No prior abd surgery.  Presented to ER.  WBC normal.  CTA positive for acute appendicitis.  Mother at bedside.  Past Medical History:  Diagnosis Date  . Amenorrhea   . Fatigue   . Migraine   . PCOS (polycystic ovarian syndrome)   . Vitamin D deficiency     Past Surgical History:  Procedure Laterality Date  . FEMUR FRACTURE SURGERY    . FRACTURE SURGERY    . KNEE SURGERY    . LEG SURGERY      Family History  Problem Relation Age of Onset  . Cancer Maternal Aunt   . Cancer Paternal Grandmother   . Hypertension Maternal Aunt   . Diabetes Maternal Aunt   . Diabetes Maternal Grandmother   . Hypertension Paternal Grandfather    Social History:  reports that she has never smoked. She has never used smokeless tobacco. She reports that she drinks alcohol. She reports that she does not use drugs.  Allergies:  Allergies  Allergen Reactions  . Adhesive [Tape]     Blisters/burns the skin    Medications Prior to Admission  Medication Sig Dispense Refill  . albuterol (PROVENTIL HFA;VENTOLIN HFA) 108 (90 BASE) MCG/ACT inhaler Inhale 2 puffs into the lungs every 4 (four) hours as needed for wheezing or shortness of breath (cough). 1 Inhaler 0  . FLUOROPLEX 1 % cream Place 1 application vaginally every Wednesday.  0  . fluticasone (FLONASE) 50 MCG/ACT nasal spray Place 1 spray into both nostrils daily as needed for allergies.     . folic acid (FOLVITE) 1 MG tablet Take 1 mg by mouth every morning.     Marland Kitchen spironolactone (ALDACTONE) 50 MG tablet Take 50 mg by mouth every evening.     . valACYclovir (VALTREX) 500  MG tablet Take 500 mg by mouth every evening.     . Vitamin D, Ergocalciferol, (DRISDOL) 50000 units CAPS capsule TAKE 50,000 UNITS BY MOUTH TWICE WEEKLY  0    Results for orders placed or performed during the hospital encounter of 11/23/16 (from the past 48 hour(s))  Lipase, blood     Status: None   Collection Time: 11/23/16  9:35 PM  Result Value Ref Range   Lipase 31 11 - 51 U/L  Comprehensive metabolic panel     Status: Abnormal   Collection Time: 11/23/16  9:35 PM  Result Value Ref Range   Sodium 135 135 - 145 mmol/L   Potassium 3.9 3.5 - 5.1 mmol/L   Chloride 100 (L) 101 - 111 mmol/L   CO2 28 22 - 32 mmol/L   Glucose, Bld 110 (H) 65 - 99 mg/dL   BUN 15 6 - 20 mg/dL   Creatinine, Ser 1.01 (H) 0.44 - 1.00 mg/dL   Calcium 9.4 8.9 - 10.3 mg/dL   Total Protein 8.2 (H) 6.5 - 8.1 g/dL   Albumin 4.3 3.5 - 5.0 g/dL   AST 18 15 - 41 U/L   ALT 16 14 - 54 U/L   Alkaline Phosphatase 67 38 - 126 U/L   Total Bilirubin 0.2 (L)  0.3 - 1.2 mg/dL   GFR calc non Af Amer >60 >60 mL/min   GFR calc Af Amer >60 >60 mL/min    Comment: (NOTE) The eGFR has been calculated using the CKD EPI equation. This calculation has not been validated in all clinical situations. eGFR's persistently <60 mL/min signify possible Chronic Kidney Disease.    Anion gap 7 5 - 15  CBC     Status: Abnormal   Collection Time: 11/23/16  9:35 PM  Result Value Ref Range   WBC 8.4 4.0 - 10.5 K/uL   RBC 5.04 3.87 - 5.11 MIL/uL   Hemoglobin 15.4 (H) 12.0 - 15.0 g/dL   HCT 44.0 36.0 - 46.0 %   MCV 87.3 78.0 - 100.0 fL   MCH 30.6 26.0 - 34.0 pg   MCHC 35.0 30.0 - 36.0 g/dL   RDW 12.4 11.5 - 15.5 %   Platelets 384 150 - 400 K/uL  Urinalysis, Routine w reflex microscopic     Status: Abnormal   Collection Time: 11/23/16 11:07 PM  Result Value Ref Range   Color, Urine YELLOW YELLOW   APPearance CLEAR CLEAR   Specific Gravity, Urine 1.021 1.005 - 1.030   pH 5.0 5.0 - 8.0   Glucose, UA NEGATIVE NEGATIVE mg/dL   Hgb urine  dipstick MODERATE (A) NEGATIVE   Bilirubin Urine NEGATIVE NEGATIVE   Ketones, ur NEGATIVE NEGATIVE mg/dL   Protein, ur NEGATIVE NEGATIVE mg/dL   Nitrite NEGATIVE NEGATIVE   Leukocytes, UA NEGATIVE NEGATIVE   RBC / HPF 6-30 0 - 5 RBC/hpf   WBC, UA 0-5 0 - 5 WBC/hpf   Bacteria, UA RARE (A) NONE SEEN   Squamous Epithelial / LPF 0-5 (A) NONE SEEN   Mucous PRESENT   POC urine preg, ED     Status: None   Collection Time: 11/23/16 11:21 PM  Result Value Ref Range   Preg Test, Ur NEGATIVE NEGATIVE    Comment:        THE SENSITIVITY OF THIS METHODOLOGY IS >24 mIU/mL   MRSA PCR Screening     Status: None   Collection Time: 11/24/16  4:22 AM  Result Value Ref Range   MRSA by PCR NEGATIVE NEGATIVE    Comment:        The GeneXpert MRSA Assay (FDA approved for NASAL specimens only), is one component of a comprehensive MRSA colonization surveillance program. It is not intended to diagnose MRSA infection nor to guide or monitor treatment for MRSA infections.    Ct Abdomen Pelvis W Contrast  Result Date: 11/24/2016 CLINICAL DATA:  Right lower quadrant pain. EXAM: CT ABDOMEN AND PELVIS WITH CONTRAST TECHNIQUE: Multidetector CT imaging of the abdomen and pelvis was performed using the standard protocol following bolus administration of intravenous contrast. CONTRAST:  127m ISOVUE-300 IOPAMIDOL (ISOVUE-300) INJECTION 61% COMPARISON:  None. FINDINGS: Lower chest: No acute abnormality. Hepatobiliary: No focal liver abnormality is seen. No gallstones, gallbladder wall thickening, or biliary dilatation. Pancreas: Unremarkable. No pancreatic ductal dilatation or surrounding inflammatory changes. Spleen: Normal in size without focal abnormality. Adrenals/Urinary Tract: Adrenal glands are unremarkable. Kidneys are normal, without renal calculi, focal lesion, or hydronephrosis. Bladder is unremarkable. Stomach/Bowel: Small hiatal hernia. Stomach and small bowel are normal. The appendix is abnormal. There is  enlargement and low-density at the tip, possibly a mucocele measuring about 13 mm. In addition, there is mural thickening, mural enhancement and periappendiceal inflammation. Findings are consistent with appendicitis. Colon is unremarkable. Vascular/Lymphatic: No significant vascular findings are present. No  enlarged abdominal or pelvic lymph nodes. Reproductive: Uterus and bilateral adnexa are unremarkable. Other: No ascites.  No extraluminal gas P Musculoskeletal: No significant skeletal lesion. IMPRESSION: Acute appendicitis. The inflammation is predominantly in the appendiceal tip. There also is an abnormal appearance of the appendiceal tip suggesting that there may have been a pre-existing mucocele. These results were called by telephone at the time of interpretation on 11/24/2016 at 12:52 am to Dr. Montine Circle , who verbally acknowledged these results. Electronically Signed   By: Andreas Newport M.D.   On: 11/24/2016 00:55    Review of Systems  Constitutional: Negative for chills, diaphoresis and fever.  HENT: Negative.   Eyes: Negative.   Respiratory: Negative.   Cardiovascular: Negative.   Gastrointestinal: Positive for abdominal pain and constipation. Negative for blood in stool, diarrhea, heartburn, melena, nausea and vomiting.  Genitourinary: Negative.   Musculoskeletal: Negative.   Skin: Negative.   Neurological: Negative.   Endo/Heme/Allergies: Negative.   Psychiatric/Behavioral: Negative.     Blood pressure 127/71, pulse 81, temperature 98 F (36.7 C), temperature source Oral, resp. rate 16, height _0  (1.702 m), weight 97 kg (213 lb 13.5 oz), SpO2 99 %. Physical Exam  Constitutional: She is oriented to person, place, and time. She appears well-developed and well-nourished. No distress.  HENT:  Head: Normocephalic and atraumatic.  Right Ear: External ear normal.  Left Ear: External ear normal.  Mouth/Throat: No oropharyngeal exudate.  Eyes: Conjunctivae are normal.  Pupils are equal, round, and reactive to light. No scleral icterus.  Neck: Normal range of motion. Neck supple. No tracheal deviation present. No thyromegaly present.  Cardiovascular: Normal rate, regular rhythm and normal heart sounds.   No murmur heard. Respiratory: Effort normal and breath sounds normal. No respiratory distress. She has no wheezes.  GI: Soft. She exhibits no distension and no mass. There is tenderness (right lower quad). There is guarding. There is no rebound.  Quiet  Musculoskeletal: Normal range of motion. She exhibits no edema, tenderness or deformity.  Neurological: She is alert and oriented to person, place, and time.  Skin: Skin is warm and dry. She is not diaphoretic.  Psychiatric: She has a normal mood and affect. Her behavior is normal.     Assessment/Plan Acute appendicitis  IV abx's started early this AM  Plan lap appendectomy, possible open  The risks and benefits of the procedure have been discussed at length with the patient.  The patient understands the proposed procedure, potential alternative treatments, and the course of recovery to be expected.  All of the patient's questions have been answered at this time.  The patient wishes to proceed with surgery.  Earnstine Regal, MD, [facility] Office: [place], MD 11/24/2016, 7:38 AM

## 2016-11-24 NOTE — Transfer of Care (Signed)
Immediate Anesthesia Transfer of Care Note  Patient: Katrina Livingston  Procedure(s) Performed: Procedure(s): APPENDECTOMY LAPAROSCOPIC (N/A)  Patient Location: PACU  Anesthesia Type:General  Level of Consciousness:  sedated, patient cooperative and responds to stimulation  Airway & Oxygen Therapy:Patient Spontanous Breathing and Patient connected to face mask oxgen  Post-op Assessment:  Report given to PACU RN and Post -op Vital signs reviewed and stable  Post vital signs:  Reviewed and stable  Last Vitals:  Vitals:   11/24/16 0241 11/24/16 0554  BP: 139/86 127/71  Pulse: 98 81  Resp: 18 16  Temp: 36.7 C 36.7 C    Complications: No apparent anesthesia complications

## 2016-11-24 NOTE — Progress Notes (Signed)
Chaplain originally spoke with pt via phone who informs chaplain she is about to be taken for surgery.  Pt is requesting to complete an advanced directive HCPOA due to her estrangement from spouse and desire for other family to make healthcare decisions should she be unable to do so for herself.  Chaplain agrees to return following pt's surgery to complete the advanced directive.  Chaplain greets pt and family, offers advanced directive education and reviews pt's completed HCPOA document.  Chaplain rounds unit to locate two witnesses and house coverage is called to notarize the document.  Chaplain makes copies and has Livingston copy entered into pt's medical record.  Pt and family express appreciation for completion of HCPOA.  Katrina Livingston, Katrina Livingston, Chaplain 11/24/16 10:53 PM     11/24/16 1630  Clinical Encounter Type  Visited With Patient and family together;Health care provider  Visit Type Initial;Post-op  Referral From Patient  Spiritual Encounters  Spiritual Needs Literature;Brochure  Stress Factors  Patient Stress Factors Exhausted;Family relationships  Family Stress Factors None identified  Advance Directives (For Healthcare)  Does Patient Have Livingston Medical Advance Directive? No  Does patient want to make changes to medical advance directive? Yes (Inpatient - patient requests chaplain consult to change Livingston medical advance directive)  Type of Advance Directive Healthcare Power of Attorney  Copy of Healthcare Power of Attorney in Chart? Yes

## 2016-11-24 NOTE — Anesthesia Preprocedure Evaluation (Signed)
Anesthesia Evaluation  Patient identified by MRN, date of birth, ID band Patient awake    Reviewed: Allergy & Precautions, H&P , NPO status , Patient's Chart, lab work & pertinent test results  Airway Mallampati: II   Neck ROM: full    Dental   Pulmonary neg pulmonary ROS,    breath sounds clear to auscultation       Cardiovascular negative cardio ROS   Rhythm:regular Rate:Normal     Neuro/Psych  Headaches,    GI/Hepatic Acute appendicitis   Endo/Other    Renal/GU      Musculoskeletal   Abdominal   Peds  Hematology   Anesthesia Other Findings   Reproductive/Obstetrics PCOS                             Anesthesia Physical Anesthesia Plan  ASA: II  Anesthesia Plan: General   Post-op Pain Management:    Induction: Intravenous  Airway Management Planned: Oral ETT  Additional Equipment:   Intra-op Plan:   Post-operative Plan: Extubation in OR  Informed Consent: I have reviewed the patients History and Physical, chart, labs and discussed the procedure including the risks, benefits and alternatives for the proposed anesthesia with the patient or authorized representative who has indicated his/her understanding and acceptance.     Plan Discussed with: CRNA, Anesthesiologist and Surgeon  Anesthesia Plan Comments:         Anesthesia Quick Evaluation

## 2016-11-25 MED ORDER — HYDROCODONE-ACETAMINOPHEN 5-325 MG PO TABS: 1.0000 | ORAL_TABLET | ORAL | 0 refills | Status: DC | PRN

## 2016-11-25 NOTE — Progress Notes (Signed)
Patient discharged to home, all discharged medications and instructions reviewed and questions answered.  Patient to be assisted to vehicle by wheelchair.  

## 2016-11-25 NOTE — Discharge Summary (Signed)
Physician Discharge Summary [facility]  Patient ID: Katrina Livingston MRN: [id number] DOB/AGE: 1981-05-19 36 y.o.  Admit date: 11/23/2016 Discharge date: 11/25/2016  Admission Diagnoses:  Acute appendicitis  Discharge Diagnoses:  Active Problems:   Appendicitis   Acute appendicitis   Discharged Condition: good  Hospital Course: Patient was admitted for observation following laparoscopic surgery.  Post op course was uncomplicated.  Pain was well controlled.  Tolerated diet.  Patient was prepared for discharge home on POD#1.  Consults: None  Treatments: surgery: laparoscopic appendectomy  Discharge Exam: Blood pressure 116/73, pulse 100, temperature 97.8 F (36.6 C), temperature source Oral, resp. rate 16, height 5\' 7"  (1.702 m), weight 97 kg (213 lb 13.5 oz), SpO2 100 %. HEENT - clear Neck - soft Abd - soft, obese, non-tender; dressings dry and intact  Disposition: Home  Discharge Instructions    Change dressing (specify)    Complete by:  As directed    May remove gauze today.  Leave Steri-strips one week.   Diet - low sodium heart healthy    Complete by:  As directed    Discharge instructions    Complete by:  As directed    [facility]  LAPAROSCOPIC SURGERY:  POST-OP INSTRUCTIONS  Always review your discharge instruction sheet given to you by the facility where your surgery was performed.  A prescription for pain medication may be given to you upon discharge.  Take your pain medication as prescribed.  If narcotic pain medicine is not needed, then you may take acetaminophen (Tylenol) or ibuprofen (Advil) as needed.  Take your usually prescribed medications unless otherwise directed.  If you need a refill on your pain medication, please contact your pharmacy.  They will contact our office to request authorization. Prescriptions will not be filled after 5 P.M. or on weekends.  You should follow a light diet the first few days  after arrival home, such as soup and crackers or toast.  Be sure to include plenty of fluids daily.  Most patients will experience some swelling and bruising in the area of the incisions.  Ice packs will help.  Swelling and bruising can take several days to resolve.   It is common to experience some constipation after surgery.  Increasing fluid intake and taking a stool softener (such as Colace) will usually help or prevent this problem from occurring.  A mild laxative (Milk of Magnesia or Miralax) should be taken according to package instructions if there has been no bowel movement after 48 hours.  You will have steri-strips and a gauze dressing over your incisions.  You may remove the gauze bandage on the second day after surgery, and you may shower at that time.  Leave your steri-strips (small skin tapes) in place directly over the incision.  These strips should remain on the skin for 5-7 days and then be removed.  You may get them wet in the shower and pat them dry.  Any sutures or staples will be removed at the office during your follow-up visit.  ACTIVITIES:  You may resume regular (light) daily activities beginning the next day - such as daily self-care, walking, climbing stairs - gradually increasing activities as tolerated.  You may have sexual intercourse when it is comfortable.  Refrain from any heavy lifting or straining until approved by your doctor.  You may drive when you are no longer taking prescription pain medication, you can comfortably wear a seatbelt, and you can safely maneuver your car and  apply brakes.  You should see your doctor in the office for a follow-up appointment approximately 2-3 weeks after your surgery.  Make sure that you call for this appointment within a day or two after you arrive home to insure a convenient appointment time.  WHEN TO CALL YOUR DOCTOR: Fever over 101.0 Inability to urinate Continued bleeding from incision Increased pain, redness, or  drainage from the incision Increasing abdominal pain  The clinic staff is available to answer your questions during regular business hours.  Please don't hesitate to call and ask to speak to one of the nurses for clinical concerns.  If you have a medical emergency, go to the nearest emergency room or call 911.  A surgeon from [facility] is always on call for the hospital.  Velora Heckler, MD, [facility] Office: [phone] Toll Free:  [phone] FAX [phone]  Website: [email or web address]   Increase activity slowly    Complete by:  As directed      Allergies as of 11/25/2016      Reactions   Adhesive [tape]    Blisters/burns the skin      Medication List    TAKE these medications   albuterol 108 (90 Base) MCG/ACT inhaler Commonly known as:  PROVENTIL HFA;VENTOLIN HFA Inhale 2 puffs into the lungs every 4 (four) hours as needed for wheezing or shortness of breath (cough).   FLUOROPLEX 1 % cream Generic drug:  fluoruracil Place 1 application vaginally every Wednesday.   fluticasone 50 MCG/ACT nasal spray Commonly known as:  FLONASE Place 1 spray into both nostrils daily as needed for allergies.   folic acid 1 MG tablet Commonly known as:  FOLVITE Take 1 mg by mouth every morning.   HYDROcodone-acetaminophen 5-325 MG tablet Commonly known as:  NORCO/VICODIN Take 1-2 tablets by mouth every 4 (four) hours as needed for moderate pain.   spironolactone 50 MG tablet Commonly known as:  ALDACTONE Take 50 mg by mouth every evening.   valACYclovir 500 MG tablet Commonly known as:  VALTREX Take 500 mg by mouth every evening.   Vitamin D (Ergocalciferol) 50000 units Caps capsule Commonly known as:  DRISDOL TAKE 50,000 UNITS BY MOUTH TWICE WEEKLY      Follow-up Information    [facility], [place]. Schedule an appointment as soon as possible for a visit in 2 week(s).   Specialty:  General Surgery Contact  information: [address] [place] [address] [phone]          Velora Heckler, MD, [facility] Office: [phone]   Signed: Velora Heckler 11/25/2016, 8:11 AM

## 2016-11-26 ENCOUNTER — Encounter (HOSPITAL_COMMUNITY): Payer: Self-pay | Admitting: Surgery

## 2016-11-26 NOTE — Anesthesia Postprocedure Evaluation (Signed)
Anesthesia Post Note  Patient: Katrina Livingston  Procedure(s) Performed: Procedure(s) (LRB): APPENDECTOMY LAPAROSCOPIC (N/A)  Patient location during evaluation: PACU Anesthesia Type: General Level of consciousness: awake and alert and patient cooperative Pain management: pain level controlled Vital Signs Assessment: post-procedure vital signs reviewed and stable Respiratory status: spontaneous breathing and respiratory function stable Cardiovascular status: stable Anesthetic complications: no       Last Vitals:  Vitals:   11/25/16 0612 11/25/16 1003  BP: 116/73 127/71  Pulse: 100 91  Resp: 16 16  Temp: 36.6 C 36.9 C    Last Pain:  Vitals:   11/25/16 1159  TempSrc:   PainSc: 5                  [name]

## 2016-11-28 ENCOUNTER — Encounter (HOSPITAL_COMMUNITY): Payer: Self-pay | Admitting: Surgery

## 2016-12-11 ENCOUNTER — Telehealth: Payer: Self-pay | Admitting: Neurology

## 2016-12-11 NOTE — Telephone Encounter (Signed)
Patient called office in reference to suppose to being set up with cpap machine at home.  Patient is not sure of the company her information was sent to, but has not heard anything after receiving approval letter from insurance company about 2 weeks ago.

## 2016-12-11 NOTE — Telephone Encounter (Signed)
I sent Katrina Livingston with [employer] a message to check on the status of this

## 2016-12-12 NOTE — Telephone Encounter (Signed)
I received a response back from Heather:  Pietro CassisHello Diana,  She is scheduled for set up next Tuesday 12/18/2016 at 2pm.  Thank you

## 2017-01-17 ENCOUNTER — Ambulatory Visit: Payer: 59 | Admitting: Neurology

## 2017-02-07 ENCOUNTER — Telehealth: Payer: Self-pay

## 2017-02-07 NOTE — Telephone Encounter (Signed)
Pt called back, spoke to our phone staff, told them that she has been using her cpap almost every night. I will reach out to [employer] to see why we cannot see any data.

## 2017-02-07 NOTE — Telephone Encounter (Signed)
I called pt. No answer, left a message asking her to call me back.  I checked the [employer] site for pt's cpap. I can see where she was set upon 12/18/16 but there is no data. I am unsure if she is using the cpap. I called pt to discuss but no answer.

## 2017-02-07 NOTE — Telephone Encounter (Signed)
I called pt and left a detailed message on her cell phone, per DPR, reminding her to bring her cpap to the appt on Monday with Dr. Frances FurbishAthar and to call back with any further questions or concerns.

## 2017-02-11 ENCOUNTER — Encounter: Payer: Self-pay | Admitting: Neurology

## 2017-02-11 ENCOUNTER — Ambulatory Visit (INDEPENDENT_AMBULATORY_CARE_PROVIDER_SITE_OTHER): Payer: 59 | Admitting: Neurology

## 2017-02-11 ENCOUNTER — Telehealth: Payer: Self-pay | Admitting: Neurology

## 2017-02-11 VITALS — BP 144/90 | HR 62 | Ht 67.0 in | Wt 221.0 lb

## 2017-02-11 DIAGNOSIS — G4733 Obstructive sleep apnea (adult) (pediatric): Secondary | ICD-10-CM

## 2017-02-11 NOTE — Telephone Encounter (Signed)
Pt needed follow up apt with CM or MM before Aug 29. No slots available - she did not want to make an apt after that due to 90-day compliance. Please contact patient to advise -[phone].

## 2017-02-11 NOTE — Patient Instructions (Addendum)
Please continue using your autoPAP regularly. While your insurance may require that you use PAP at least 4 hours each night on 70% of the nights, I recommend, that you not skip any nights and use it throughout the night if you can. Getting used to PAP and staying with the treatment long term does take time and patience and discipline. Untreated obstructive sleep apnea when it is moderate to severe can have an adverse impact on cardiovascular health and raise her risk for heart disease, arrhythmias, hypertension, congestive heart failure, stroke and diabetes. Untreated obstructive sleep apnea causes sleep disruption, nonrestorative sleep, and sleep deprivation. This can have an impact on your day to day functioning and cause daytime sleepiness and impairment of cognitive function, memory loss, mood disturbance, and problems focussing. Using PAP regularly can improve these symptoms.  We can do a 5 week check up with one of our nurse practitioners Eber Jonesarolyn or [address]. Keep working on it. Alternatively, we can consider a dental appliance, please continue to work on your weight.
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
## 2017-02-11 NOTE — Telephone Encounter (Signed)
I spoke with Dr. Frances FurbishAthar, she is agreeable to seeing the pt at 8:00am since the NPs are full. I called pt, she is agreeable to seeing Dr. Frances FurbishAthar on 03/11/2017 at 8:00am. Pt verbalized understanding of new appt date and time.

## 2017-03-07 ENCOUNTER — Telehealth: Payer: Self-pay

## 2017-03-07 NOTE — Telephone Encounter (Signed)
I called pt. There is not an updated cpap download on [facility], there is no data since 02/11/2017. I called pt to find out if she is still using her cpap and to ask her to bring it, if so. No answer, left a message asking her to call me back to discuss.

## 2017-03-11 ENCOUNTER — Ambulatory Visit: Payer: Self-pay | Admitting: Neurology

## 2017-03-12 ENCOUNTER — Encounter: Payer: Self-pay | Admitting: Neurology

## 2017-03-14 ENCOUNTER — Ambulatory Visit (INDEPENDENT_AMBULATORY_CARE_PROVIDER_SITE_OTHER): Payer: 59 | Admitting: Neurology

## 2017-03-14 ENCOUNTER — Encounter: Payer: Self-pay | Admitting: Neurology

## 2017-03-14 VITALS — BP 145/93 | HR 63 | Ht 67.0 in | Wt 219.5 lb

## 2017-03-14 DIAGNOSIS — Z9989 Dependence on other enabling machines and devices: Secondary | ICD-10-CM | POA: Diagnosis not present

## 2017-03-14 DIAGNOSIS — G4733 Obstructive sleep apnea (adult) (pediatric): Secondary | ICD-10-CM | POA: Diagnosis not present
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
## 2017-03-14 NOTE — Patient Instructions (Signed)
Please use your autoPAP regularly. While your insurance requires that you use PAP at least 4 hours each night on 70% of the nights, I recommend, that you not skip any nights and use it throughout the night if you can. Getting used to PAP and staying with the treatment long term does take time and patience and discipline. Untreated obstructive sleep apnea when it is moderate to severe can have an adverse impact on cardiovascular health and raise her risk for heart disease, arrhythmias, hypertension, congestive heart failure, stroke and diabetes. Untreated obstructive sleep apnea causes sleep disruption, nonrestorative sleep, and sleep deprivation. This can have an impact on your day to day functioning and cause daytime sleepiness and impairment of cognitive function, memory loss, mood disturbance, and problems focussing. Using PAP regularly can improve these symptoms.  As it stands, and we have discussed this last time, you are not using the machine enough to get most benefit out of it, such as improvement of sleep quality and daytime sleepiness.  Your compliance card report indicates, that you have used your machine in the last 30 days, only 14 days, but also not enough hours during those 14 days. Please work on it.

## 2017-09-15 ENCOUNTER — Telehealth: Payer: Self-pay | Admitting: *Deleted

## 2017-09-15 NOTE — Telephone Encounter (Signed)
Spoke with patient and informed her NP will be out tomorrow due to illness. She stated she has office number. Requested she call to reschedule, advised the office will reach back out to her to reschedule if she has been unable to do so in a few days. She verbalized understanding,, appreciation.

## 2017-09-16 ENCOUNTER — Ambulatory Visit: Payer: 59 | Admitting: Adult Health

## 2017-12-15 ENCOUNTER — Encounter (HOSPITAL_COMMUNITY): Payer: Self-pay

## 2017-12-15 ENCOUNTER — Emergency Department (HOSPITAL_COMMUNITY)
Admission: EM | Admit: 2017-12-15 | Discharge: 2017-12-16 | Disposition: A | Discharge status: Home / Self Care | Payer: 59 | Attending: Emergency Medicine | Admitting: Emergency Medicine

## 2017-12-15 ENCOUNTER — Other Ambulatory Visit: Payer: Self-pay

## 2017-12-15 DIAGNOSIS — R112 Nausea with vomiting, unspecified: Secondary | ICD-10-CM | POA: Insufficient documentation

## 2017-12-15 DIAGNOSIS — R197 Diarrhea, unspecified: Secondary | ICD-10-CM | POA: Insufficient documentation

## 2017-12-15 DIAGNOSIS — R1084 Generalized abdominal pain: Secondary | ICD-10-CM | POA: Diagnosis present

## 2017-12-15 DIAGNOSIS — Z79899 Other long term (current) drug therapy: Secondary | ICD-10-CM | POA: Insufficient documentation

## 2017-12-15 LAB — COMPREHENSIVE METABOLIC PANEL
ALBUMIN: 4 g/dL (ref 3.5–5.0)
ALK PHOS: 62 U/L (ref 38–126)
ALT: 16 U/L (ref 14–54)
AST: 17 U/L (ref 15–41)
Anion gap: 10 (ref 5–15)
BILIRUBIN TOTAL: 1.1 mg/dL (ref 0.3–1.2)
BUN: 10 mg/dL (ref 6–20)
CALCIUM: 8.9 mg/dL (ref 8.9–10.3)
CO2: 24 mmol/L (ref 22–32)
Chloride: 104 mmol/L (ref 101–111)
Creatinine, Ser: 0.93 mg/dL (ref 0.44–1.00)
GFR calc Af Amer: >60 mL/min (ref >60)
GFR calc non Af Amer: >60 mL/min (ref >60)
GLUCOSE: 96 mg/dL (ref 65–99)
Potassium: 3.8 mmol/L (ref 3.5–5.1)
Sodium: 138 mmol/L (ref 135–145)
TOTAL PROTEIN: 7.5 g/dL (ref 6.5–8.1)

## 2017-12-15 LAB — URINALYSIS, ROUTINE W REFLEX MICROSCOPIC
BACTERIA UA: NONE SEEN
Bilirubin Urine: NEGATIVE
Glucose, UA: NEGATIVE mg/dL
Ketones, ur: NEGATIVE mg/dL
Leukocytes, UA: NEGATIVE
NITRITE: NEGATIVE
Protein, ur: NEGATIVE mg/dL
Specific Gravity, Urine: 1.02 (ref 1.005–1.030)
pH: 5 (ref 5.0–8.0)

## 2017-12-15 LAB — LIPASE, BLOOD: Lipase: 27 U/L (ref 11–51)

## 2017-12-15 LAB — TYPE AND SCREEN
ABO/RH(D): A POS
ANTIBODY SCREEN: NEGATIVE

## 2017-12-15 LAB — CBC
HEMATOCRIT: 41.2 % (ref 36.0–46.0)
Hemoglobin: 14.3 g/dL (ref 12.0–15.0)
MCH: 30 pg (ref 26.0–34.0)
MCHC: 34.7 g/dL (ref 30.0–36.0)
MCV: 86.6 fL (ref 78.0–100.0)
Platelets: 329 10*3/uL (ref 150–400)
RBC: 4.76 MIL/uL (ref 3.87–5.11)
RDW: 12.2 % (ref 11.5–15.5)
WBC: 5.8 10*3/uL (ref 4.0–10.5)

## 2017-12-15 LAB — POC OCCULT BLOOD, ED: FECAL OCCULT BLD: NEGATIVE

## 2017-12-15 LAB — I-STAT BETA HCG BLOOD, ED (MC, WL, AP ONLY): I-stat hCG, quantitative: <5 m[IU]/mL (ref <5)

## 2017-12-15 MED ORDER — DICYCLOMINE HCL 20 MG PO TABS: 20.0000 mg | ORAL_TABLET | Freq: Three times a day (TID) | ORAL | 0 refills | Status: DC

## 2017-12-15 MED ORDER — DICYCLOMINE HCL 10 MG PO CAPS
10.0000 mg | ORAL_CAPSULE | Freq: Once | ORAL | Status: AC
  Administered 2017-12-15: 10 mg via ORAL
  Filled 2017-12-15: qty 1

## 2017-12-15 MED ORDER — LOPERAMIDE HCL 2 MG PO CAPS: 2.0000 mg | ORAL_CAPSULE | Freq: Four times a day (QID) | ORAL | 0 refills | Status: DC | PRN

## 2017-12-15 MED ORDER — ONDANSETRON 4 MG PO TBDP
4.0000 mg | ORAL_TABLET | Freq: Once | ORAL | Status: AC | PRN
  Administered 2017-12-15: 4 mg via ORAL
  Filled 2017-12-15: qty 1

## 2017-12-15 MED ORDER — SODIUM CHLORIDE 0.9 % IV BOLUS
1000.0000 mL | Freq: Once | INTRAVENOUS | Status: AC
  Administered 2017-12-15: 1000 mL via INTRAVENOUS

## 2017-12-15 MED ORDER — ONDANSETRON HCL 4 MG/2ML IJ SOLN
4.0000 mg | Freq: Once | INTRAMUSCULAR | Status: AC
  Administered 2017-12-15: 4 mg via INTRAVENOUS
  Filled 2017-12-15: qty 2

## 2017-12-15 MED ORDER — ONDANSETRON 4 MG PO TBDP: 4.0000 mg | ORAL_TABLET | Freq: Three times a day (TID) | ORAL | 0 refills | Status: DC | PRN

## 2017-12-15 MED ORDER — LOPERAMIDE HCL 2 MG PO CAPS
2.0000 mg | ORAL_CAPSULE | Freq: Once | ORAL | Status: AC
  Administered 2017-12-15: 2 mg via ORAL
  Filled 2017-12-15: qty 1

## 2017-12-15 NOTE — ED Provider Notes (Signed)
Emergency Department Provider Note   I have reviewed the triage vital signs and the nursing notes.   HISTORY  Chief Complaint Abdominal Pain   HPI Katrina Livingston is a 37 y.o. female with PMH of PCOS presents to the emergency department for evaluation of cramping abdominal pain with diarrhea, nausea, vomiting, diaphoresis.  Patient denies any chest pain or dyspnea.  No fevers or chills.  Patient symptoms began abruptly early this morning.  She has noticed some red on the tissue when she wipes after bowel movements and began seeing a red, mucousy material around her bowel movements afterwards.  She denies any lack, tarry stools.  No recent travel or sick contacts.  No recent antibiotics.  No over-the-counter medicines taken prior to arrival.  Past Medical History:  Diagnosis Date  . Amenorrhea   . Fatigue   . Migraine   . PCOS (polycystic ovarian syndrome)   . Vitamin D deficiency     Patient Active Problem List   Diagnosis Date Noted  . Appendicitis 11/24/2016  . Acute appendicitis 11/24/2016  . Stutter 08/08/2015    Past Surgical History:  Procedure Laterality Date  . FEMUR FRACTURE SURGERY    . FRACTURE SURGERY    . KNEE SURGERY    . LAPAROSCOPIC APPENDECTOMY N/A 11/24/2016   Procedure: APPENDECTOMY LAPAROSCOPIC;  Surgeon: Darnell Level, MD;  Location: [facility];  Service: General;  Laterality: N/A;  . LEG SURGERY      Current Outpatient Rx  . Order #: [id number] Class: Print  . Order #: [id number] Class: Print  . Order #: [id number] Class: Historical Med  . Order #: [id number] Class: Historical Med  . Order #: [id number] Class: Print  . Order #: [id number] Class: Print  . Order #: [id number] Class: Historical Med  . Order #: [id number] Class: Historical Med  . Order #: [id number] Class: Historical Med    Allergies Adhesive [tape]  Family History  Problem Relation Age of Onset  . Cancer Maternal Aunt   . Cancer Paternal Grandmother   . Hypertension Maternal Aunt   .  Diabetes Maternal Aunt   . Diabetes Maternal Grandmother   . Hypertension Paternal Grandfather     Social History Social History   Tobacco Use  . Smoking status: Never Smoker  . Smokeless tobacco: Never Used  Substance Use Topics  . Alcohol use: Yes    Alcohol/week: 0.6 oz    Types: 1 Glasses of wine per week    Comment: occasionally   . Drug use: No    Review of Systems  Constitutional: No fever/chills Eyes: No visual changes. ENT: No sore throat. Cardiovascular: Denies chest pain. Respiratory: Denies shortness of breath. Gastrointestinal: Cramping diffuse abdominal pain. Positive nausea, vomiting, and diarrhea.  No constipation. Genitourinary: Negative for dysuria. Musculoskeletal: Negative for back pain. Skin: Negative for rash. Neurological: Negative for headaches, focal weakness or numbness.  10-point ROS otherwise negative.  ____________________________________________   PHYSICAL EXAM:  VITAL SIGNS: ED Triage Vitals  Enc Vitals Group     BP 12/15/17 1650 (!) 144/101     Pulse Rate 12/15/17 1650 75     Resp 12/15/17 1650 16     Temp 12/15/17 1650 98.2 F (36.8 C)     Temp Source 12/15/17 1650 Oral     SpO2 12/15/17 1650 99 %     Weight 12/15/17 1650 211 lb (95.7 kg)     Height 12/15/17 1650  (1.676 m)     Pain Score 12/15/17 1655 10   Constitutional: Alert and oriented. Well appearing  and in no acute distress. Eyes: Conjunctivae are normal.  Head: Atraumatic. Nose: No congestion/rhinnorhea. Mouth/Throat: Mucous membranes are slightly dry.  Neck: No stridor.   Cardiovascular: Normal rate, regular rhythm. Good peripheral circulation. Grossly normal heart sounds.   Respiratory: Normal respiratory effort.  No retractions. Lungs CTAB. Gastrointestinal: Soft with mild diffuse tenderness. No focal tenderness, rebound, or guarding. No distention. Rectal exam performed with nurse tech chaperone. No gross blood or melena. Hemoccult negative.    Musculoskeletal: No lower extremity tenderness nor edema. No gross deformities of extremities. Neurologic:  Normal speech and language. No gross focal neurologic deficits are appreciated.  Skin:  Skin is warm, dry and intact. No rash noted.   ____________________________________________   LABS (all labs ordered are listed, but only abnormal results are displayed)  Labs Reviewed  URINALYSIS, ROUTINE W REFLEX MICROSCOPIC - Abnormal; Notable for the following components:      Result Value   Hgb urine dipstick MODERATE (*)    All other components within normal limits  LIPASE, BLOOD  COMPREHENSIVE METABOLIC PANEL  CBC  I-STAT BETA HCG BLOOD, ED ([facility], [facility], [facility] ONLY)  POC OCCULT BLOOD, ED  POC OCCULT BLOOD, ED  TYPE AND SCREEN  ABO/RH   ____________________________________________  RADIOLOGY  None ____________________________________________   PROCEDURES  Procedure(s) performed:   Procedures  None ____________________________________________   INITIAL IMPRESSION / ASSESSMENT AND PLAN / ED COURSE  Pertinent labs & imaging results that were available during my care of the patient were reviewed by me and considered in my medical decision making (see chart for details).  Patient presents to the ED for evaluation of cramping abdominal pain, nausea, vomiting, and diarrhea. Some concern for blood in the stool. I reviewed a pic on the patient's phone. This appears to be a red mucous rather than frank bleeding. Rectal exam shows no gross blood or melena. Hemoccult negative. Labs reviewed with no acute findings. Patient given IVF and meds for cramping and nausea. Patient is feeling slightly better. No indication for CT abdomen at this time after exam. Suspect viral enteritis.   At this time, I do not feel there is any life-threatening condition present. I have reviewed and discussed all results (EKG, imaging, lab, urine as appropriate), exam findings with patient. I have reviewed  nursing notes and appropriate previous records.  I feel the patient is safe to be discharged home without further emergent workup. Discussed usual and customary return precautions. Patient and family (if present) verbalize understanding and are comfortable with this plan.  Patient will follow-up with their primary care provider. If they do not have a primary care provider, information for follow-up has been provided to them. All questions have been answered.  ____________________________________________  FINAL CLINICAL IMPRESSION(S) / ED DIAGNOSES  Final diagnoses:  Generalized abdominal pain  Nausea vomiting and diarrhea     MEDICATIONS GIVEN DURING THIS VISIT:  Medications  ondansetron (ZOFRAN-ODT) disintegrating tablet 4 mg (4 mg Oral Given 12/15/17 1709)  sodium chloride 0.9 % bolus 1,000 mL (0 mLs Intravenous Stopped 12/16/17 0035)  dicyclomine (BENTYL) capsule 10 mg (10 mg Oral Given 12/15/17 2252)  ondansetron (ZOFRAN) injection 4 mg (4 mg Intravenous Given 12/15/17 2257)  loperamide (IMODIUM) capsule 2 mg (2 mg Oral Given 12/15/17 2253)     NEW OUTPATIENT MEDICATIONS STARTED DURING THIS VISIT:  Discharge Medication List as of 12/15/2017 11:27 PM    START taking these medications   Details  dicyclomine (BENTYL) 20 MG tablet Take 1 tablet (20 mg total) by  mouth 3 (three) times daily before meals., Starting Sun 12/15/2017, Print    loperamide (IMODIUM) 2 MG capsule Take 1 capsule (2 mg total) by mouth 4 (four) times daily as needed for diarrhea or loose stools., Starting Sun 12/15/2017, Print    ondansetron (ZOFRAN ODT) 4 MG disintegrating tablet Take 1 tablet (4 mg total) by mouth every 8 (eight) hours as needed for nausea or vomiting., Starting Sun 12/15/2017, Print        Note:  This document was prepared using Dragon voice recognition software and may include unintentional dictation errors.  Alona Bene, MD Emergency Medicine    Marcy Bogosian, Arlyss Repress, MD 12/16/17 [phone]

## 2017-12-15 NOTE — ED Triage Notes (Signed)
PT C/O SHARP ABDOMINAL PAIN AND DIARRHEA SINCE 5 AM TODAY. PT STS 1 HR AGO THERE WAS BLOOD WITH THE SEMI-SOLID STOOL. PT ALSO HAD VOMITING X3. DENIES FEVER.

## 2017-12-15 NOTE — Discharge Instructions (Signed)
We believe your symptoms are caused by either a viral infection or possible a bad food exposure.  Either way, since your symptoms have improved, we feel it is safe for you to go home and follow up with your regular doctor.  Please read the included information and stick to a bland diet for the next two days.  Drink plenty of clear fluids, and if you were provided with a prescription, please take it according to the label instructions.   ° °If you develop any new or worsening symptoms, including persistent vomiting not controlled with medication, fever greater than 101, severe or worsening abdominal pain, or other symptoms that concern you, please return immediately to the Emergency Department. ° °Viral Gastroenteritis  °Viral gastroenteritis is also known as stomach flu. This condition affects the stomach and intestinal tract. It can cause sudden diarrhea and vomiting. The illness typically lasts 3 to 8 days. Most people develop an immune response that eventually gets rid of the virus. While this natural response develops, the virus can make you quite ill.  °CAUSES  °Many different viruses can cause gastroenteritis, such as rotavirus or noroviruses. You can catch one of these viruses by consuming contaminated food or water. You may also catch a virus by sharing utensils or other personal items with an infected person or by touching a contaminated surface.  °SYMPTOMS  °The most common symptoms are diarrhea and vomiting. These problems can cause a severe loss of body fluids (dehydration) and a body salt (electrolyte) imbalance. Other symptoms may include:  °Fever.  °Headache.  °Fatigue.  °Abdominal pain. °DIAGNOSIS  °Your caregiver can usually diagnose viral gastroenteritis based on your symptoms and a physical exam. A stool sample may also be taken to test for the presence of viruses or other infections.  °TREATMENT  °This illness typically goes away on its own. Treatments are aimed at rehydration. The most serious  cases of viral gastroenteritis involve vomiting so severely that you are not able to keep fluids down. In these cases, fluids must be given through an intravenous line (IV).  °HOME CARE INSTRUCTIONS  °Drink enough fluids to keep your urine clear or pale yellow. Drink small amounts of fluids frequently and increase the amounts as tolerated.  °Ask your caregiver for specific rehydration instructions.  °Avoid:  °Foods high in sugar.  °Alcohol.  °Carbonated drinks.  °Tobacco.  °Juice.  °Caffeine drinks.  °Extremely hot or cold fluids.  °Fatty, greasy foods.  °Too much intake of anything at one time.  °Dairy products until 24 to 48 hours after diarrhea stops. °You may consume probiotics. Probiotics are active cultures of beneficial bacteria. They may lessen the amount and number of diarrheal stools in adults. Probiotics can be found in yogurt with active cultures and in supplements.  °Wash your hands well to avoid spreading the virus.  °Only take over-the-counter or prescription medicines for pain, discomfort, or fever as directed by your caregiver. Do not give aspirin to children. Antidiarrheal medicines are not recommended.  °Ask your caregiver if you should continue to take your regular prescribed and over-the-counter medicines.  °Keep all follow-up appointments as directed by your caregiver. °SEEK IMMEDIATE MEDICAL CARE IF:  °You are unable to keep fluids down.  °You do not urinate at least once every 6 to 8 hours.  °You develop shortness of breath.  °You notice blood in your stool or vomit. This may look like coffee grounds.  °You have abdominal pain that increases or is concentrated in one small area (  localized).  °You have persistent vomiting or diarrhea.  °You have a fever.  °The patient is a child younger than 3 months, and he or she has a fever.  °The patient is a child older than 3 months, and he or she has a fever and persistent symptoms.  °The patient is a child older than 3 months, and he or she has a fever  and symptoms suddenly get worse.  °The patient is a baby, and he or she has no tears when crying. °MAKE SURE YOU:  °Understand these instructions.  °Will watch your condition.  °Will get help right away if you are not doing well or get worse. °This information is not intended to replace advice given to you by your health care provider. Make sure you discuss any questions you have with your health care provider.  °Document Released: 07/09/2005 Document Revised: 10/01/2011 Document Reviewed: 04/25/2011  °Elsevier Interactive Patient Education ©2016 [employer]  ° °

## 2017-12-15 NOTE — ED Notes (Signed)
Patient given water

## 2017-12-16 LAB — ABO/RH: ABO/RH(D): A POS

## 2018-06-12 IMAGING — CT CT ABD-PELV W/ CM
2 of 4 series · 16 of 46 positions shown, 18 images · IV contrast (ISOVUE)
Comparison: None.

CLINICAL DATA: Right lower quadrant pain.

EXAM:
CT ABDOMEN AND PELVIS WITH CONTRAST
TECHNIQUE: Multidetector CT imaging of the abdomen and pelvis was performed
using the standard protocol following bolus administration of
intravenous contrast.
CONTRAST:  100mL EFZEXI-MUU IOPAMIDOL (EFZEXI-MUU) INJECTION 61%

[Series 2: abd/pel with · axial · 0.78mm/px · z∈[-461,-56]mm · 13 of 93 slices shown, 15 images]
[im 6/93  soft-tissue]
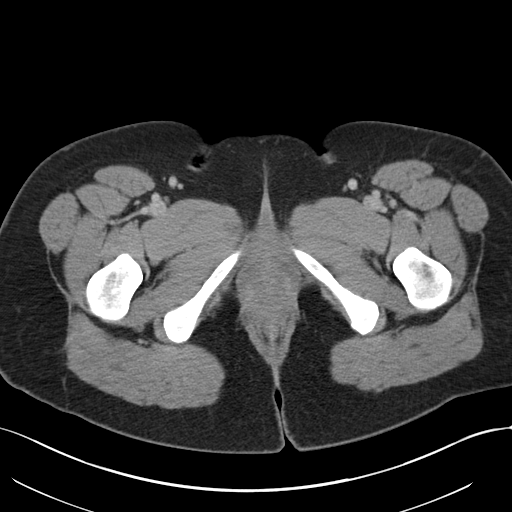
[im 6/93  bone]
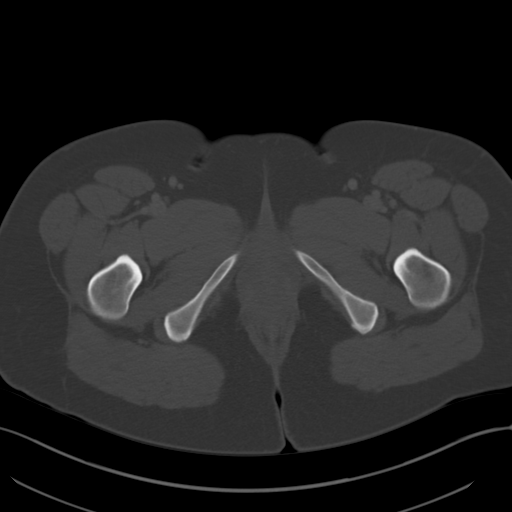
[im 11/93  soft-tissue]
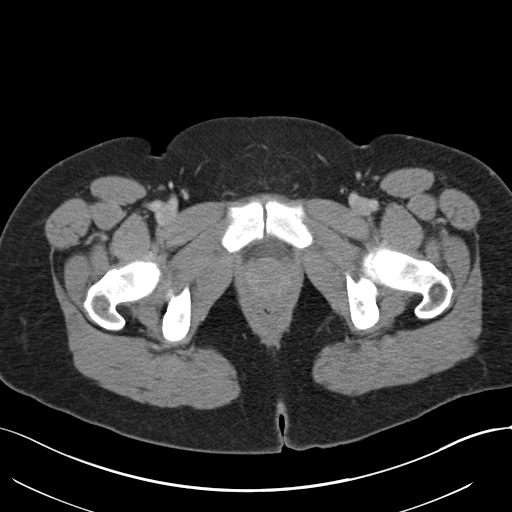
[im 21/93  soft-tissue]
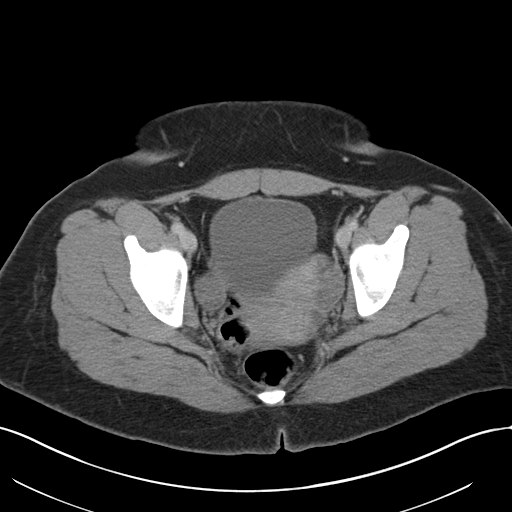
[im 26/93  soft-tissue]
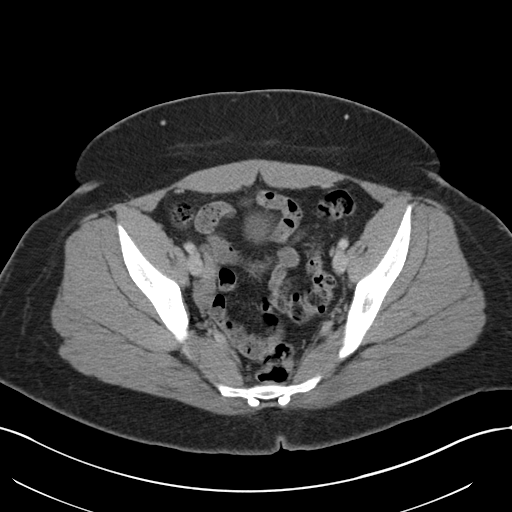
[im 31/93  soft-tissue]
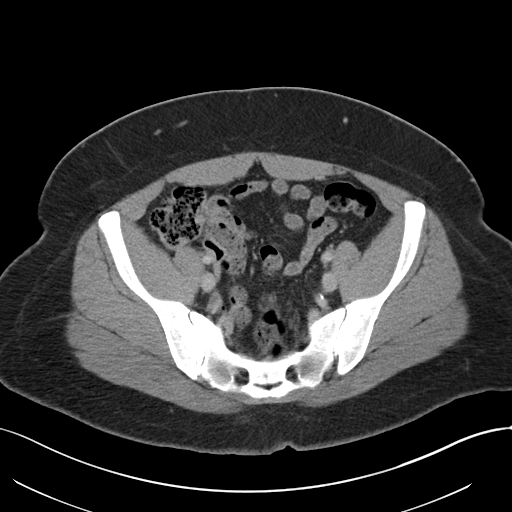
[im 41/93  soft-tissue]
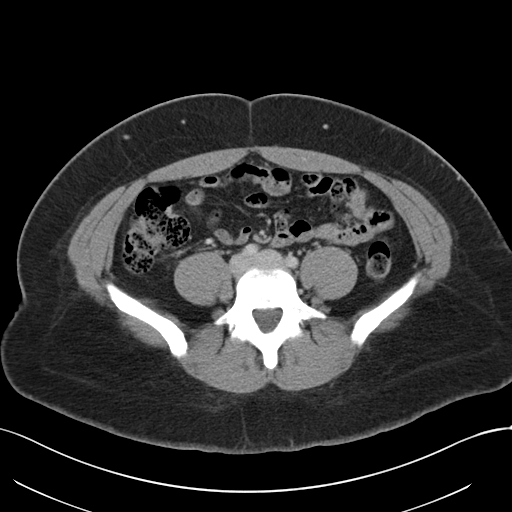
[im 47/93  soft-tissue]
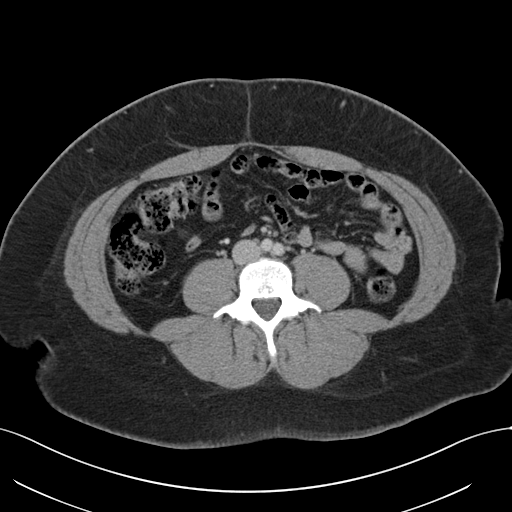
[im 52/93  soft-tissue]
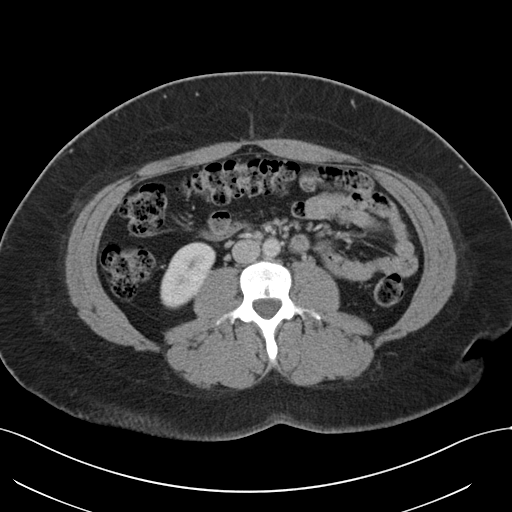
[im 62/93  soft-tissue]
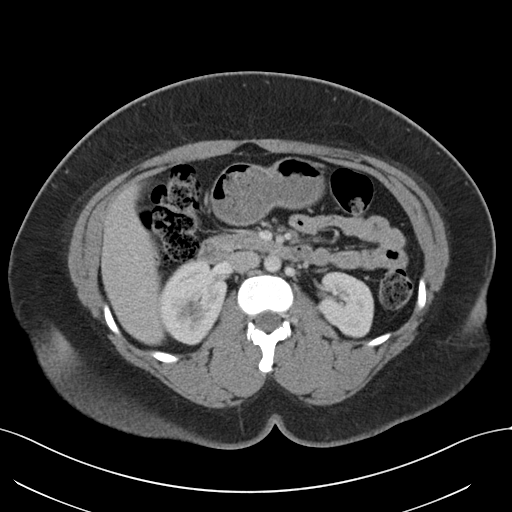
[im 62/93  bone]
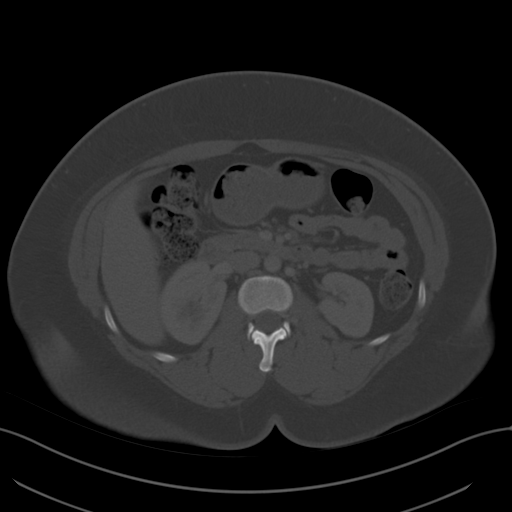
[im 67/93  soft-tissue]
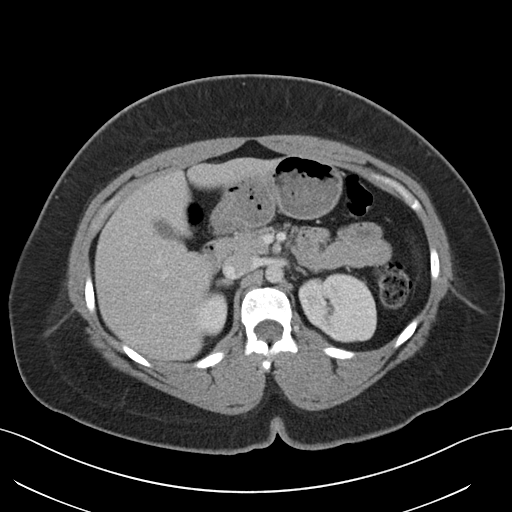
[im 72/93  soft-tissue]
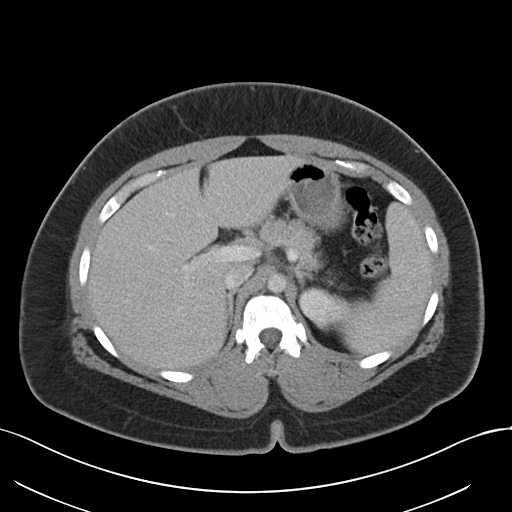
[im 82/93  soft-tissue]
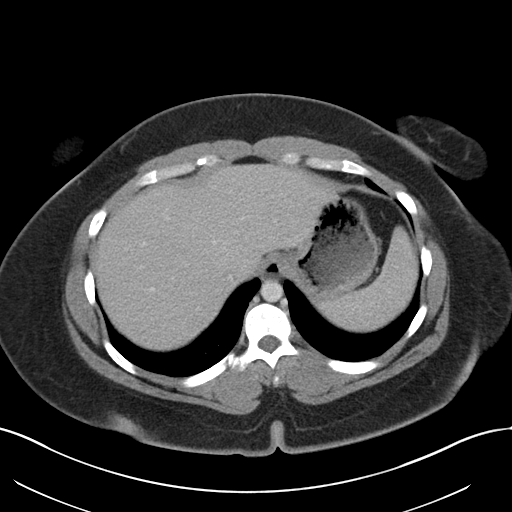
[im 87/93  soft-tissue]
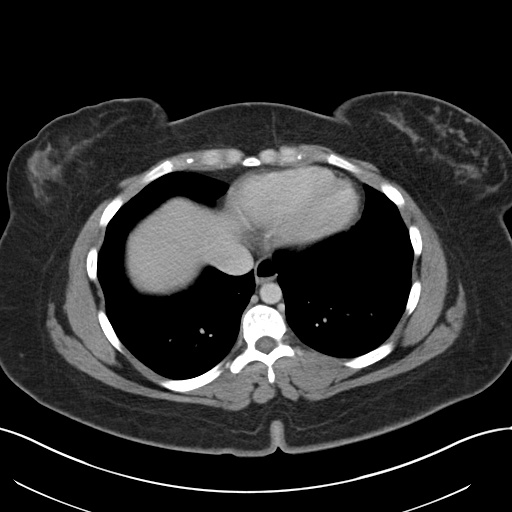

[Series 5: coronal a/|p · coronal · 0.79mm/px · 3 of 141 slices shown]
[im 47/141  soft-tissue]
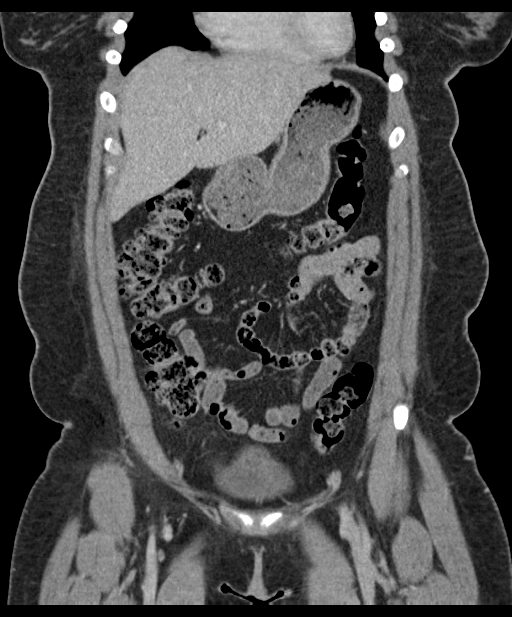
[im 63/141  soft-tissue]
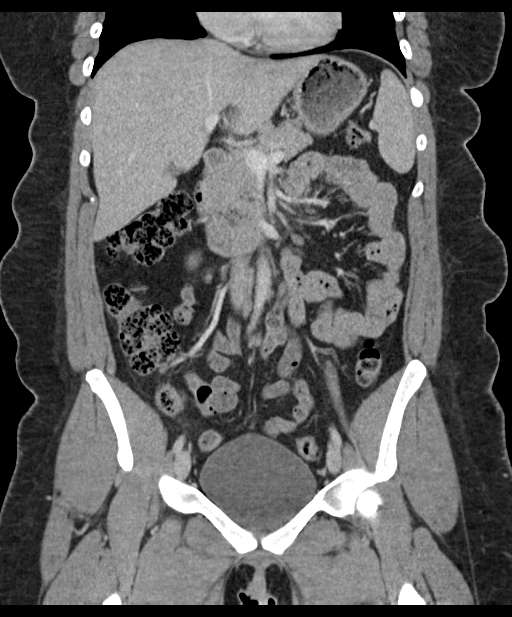
[im 78/141  soft-tissue]
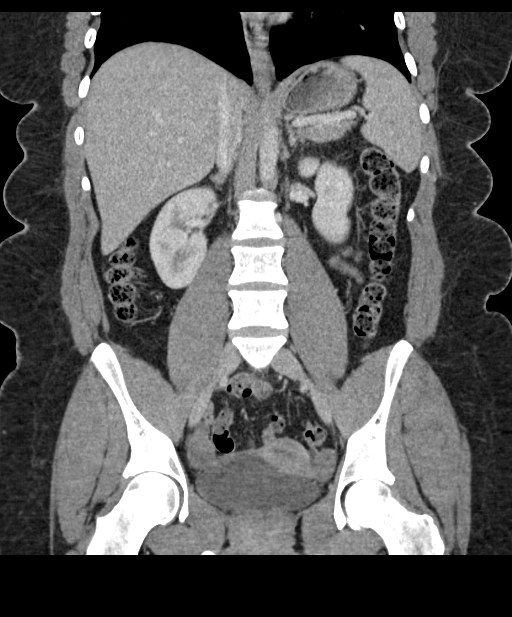

[16 of 46 positions shown; findings below may reference images not displayed]

FINDINGS: Lower chest: No acute abnormality.

Hepatobiliary: No focal liver abnormality is seen. No gallstones,
gallbladder wall thickening, or biliary dilatation.

Pancreas: Unremarkable. No pancreatic ductal dilatation or
surrounding inflammatory changes.

Spleen: Normal in size without focal abnormality.

Adrenals/Urinary Tract: Adrenal glands are unremarkable. Kidneys are
normal, without renal calculi, focal lesion, or hydronephrosis.
Bladder is unremarkable.

Stomach/Bowel: Small hiatal hernia. Stomach and small bowel are
normal. The appendix is abnormal. There is enlargement and
low-density at the tip, possibly a mucocele measuring about 13 mm.
In addition, there is mural thickening, mural enhancement and
periappendiceal inflammation. Findings are consistent with
appendicitis.

Colon is unremarkable.

Vascular/Lymphatic: No significant vascular findings are present. No
enlarged abdominal or pelvic lymph nodes.

Reproductive: Uterus and bilateral adnexa are unremarkable.

Other: No ascites.  No extraluminal gas P

Musculoskeletal: No significant skeletal lesion.
IMPRESSION: Acute appendicitis. The inflammation is predominantly in the
appendiceal tip. There also is an abnormal appearance of the
appendiceal tip suggesting that there may have been a pre-existing
mucocele. These results were called by telephone at the time of
interpretation on 11/24/2016 at [DATE] to Dr. VELIA TOLENTINO , who
verbally acknowledged these results.

## 2018-09-15 ENCOUNTER — Ambulatory Visit (HOSPITAL_COMMUNITY)
Admission: EM | Admit: 2018-09-15 | Discharge: 2018-09-15 | Disposition: A | Discharge status: Home / Self Care | Payer: 59 | Attending: Family Medicine | Admitting: Family Medicine

## 2018-09-15 ENCOUNTER — Other Ambulatory Visit: Payer: Self-pay

## 2018-09-15 ENCOUNTER — Encounter (HOSPITAL_COMMUNITY): Payer: Self-pay | Admitting: Emergency Medicine

## 2018-09-15 DIAGNOSIS — R600 Localized edema: Secondary | ICD-10-CM | POA: Diagnosis not present

## 2018-09-15 LAB — BASIC METABOLIC PANEL
Anion gap: 7 (ref 5–15)
BUN: 8 mg/dL (ref 6–20)
CO2: 28 mmol/L (ref 22–32)
Calcium: 8.9 mg/dL (ref 8.9–10.3)
Chloride: 102 mmol/L (ref 98–111)
Creatinine, Ser: 0.96 mg/dL (ref 0.44–1.00)
GFR calc Af Amer: >60 mL/min (ref >60)
GFR calc non Af Amer: >60 mL/min (ref >60)
Glucose, Bld: 99 mg/dL (ref 70–99)
Potassium: 3.7 mmol/L (ref 3.5–5.1)
Sodium: 137 mmol/L (ref 135–145)

## 2018-09-15 LAB — CBC
HCT: 41.7 % (ref 36.0–46.0)
Hemoglobin: 13.7 g/dL (ref 12.0–15.0)
MCH: 28.7 pg (ref 26.0–34.0)
MCHC: 32.9 g/dL (ref 30.0–36.0)
MCV: 87.2 fL (ref 80.0–100.0)
Platelets: 398 10*3/uL (ref 150–400)
RBC: 4.78 MIL/uL (ref 3.87–5.11)
RDW: 12.4 % (ref 11.5–15.5)
WBC: 7.2 10*3/uL (ref 4.0–10.5)
nRBC: 0 % (ref 0.0–0.2)

## 2018-09-15 LAB — TSH: TSH: 1.291 u[IU]/mL (ref 0.350–4.500)

## 2018-09-15 MED ORDER — FUROSEMIDE 20 MG PO TABS: 20.0000 mg | ORAL_TABLET | Freq: Every day | ORAL | 0 refills | Status: DC

## 2018-09-15 MED ORDER — FUROSEMIDE 20 MG PO TABS: 20.0000 mg | ORAL_TABLET | Freq: Every day | ORAL | 0 refills | Status: AC

## 2018-09-15 NOTE — ED Triage Notes (Signed)
Leg and feet swelling started on Saturday.  Patient had on shoes that she normally wears.  She removed shoes that night and continued to feel pain in feet.  Patient noticed swelling.  Over the weekend swelling did resolve slightly, enough to where it did not hurt as bad.  Yesterday, legs started swelling.  Denies chest pain, denies sob

## 2018-09-15 NOTE — ED Provider Notes (Signed)
[facility]    CSN: [id number] Arrival date & time: 09/15/18  1654     History   Chief Complaint Chief Complaint  Patient presents with  . Leg Swelling    HPI Katrina Livingston is a 38 y.o. female.   Leg and feet swelling started on Saturday.  Patient had on shoes that she normally wears.  She removed shoes that night and continued to feel pain in feet.  Patient noticed swelling.  Over the weekend swelling did resolve slightly, enough to where it did not hurt as bad.  Yesterday, legs started swelling.  Denies chest pain, denies sob  Patient has not had any salty meals lately.  She has had no shortness of breath or orthopnea.  She is never had this problem before.  She started no new medicines recently.     Past Medical History:  Diagnosis Date  . Amenorrhea   . Fatigue   . Migraine   . PCOS (polycystic ovarian syndrome)   . Vitamin D deficiency     Patient Active Problem List   Diagnosis Date Noted  . Appendicitis 11/24/2016  . Acute appendicitis 11/24/2016  . Stutter 08/08/2015    Past Surgical History:  Procedure Laterality Date  . FEMUR FRACTURE SURGERY    . FRACTURE SURGERY    . KNEE SURGERY    . LAPAROSCOPIC APPENDECTOMY N/A 11/24/2016   Procedure: APPENDECTOMY LAPAROSCOPIC;  Surgeon: Armandina Gemma, MD;  Location: [facility];  Service: General;  Laterality: N/A;  . LEG SURGERY      OB History   No obstetric history on file.      Home Medications    Prior to Admission medications   Medication Sig Start Date End Date Taking? Authorizing Provider  valACYclovir (VALTREX) 500 MG tablet Take 500 mg by mouth every evening.    Yes [provider]  albuterol (PROVENTIL HFA;VENTOLIN HFA) 108 (90 BASE) MCG/ACT inhaler Inhale 2 puffs into the lungs every 4 (four) hours as needed for wheezing or shortness of breath (cough). 06/29/15   Melony Overly, MD  fluticasone (FLONASE) 50 MCG/ACT nasal spray Place 1 spray into both nostrils daily as needed  for allergies.     [provider]  folic acid (FOLVITE) 1 MG tablet Take 1 mg by mouth every morning.     [provider]  furosemide (LASIX) 20 MG tablet Take 1 tablet (20 mg total) by mouth daily. 09/15/18   Robyn Haber, MD  Vitamin D, Ergocalciferol, (DRISDOL) 50000 units CAPS capsule TAKE 50,000 UNITS BY MOUTH TWICE WEEKLY 07/21/15   [provider]    Family History Family History  Problem Relation Age of Onset  . Cancer Maternal Aunt   . Cancer Paternal Grandmother   . Hypertension Maternal Aunt   . Diabetes Maternal Aunt   . Diabetes Maternal Grandmother   . Hypertension Paternal Grandfather     Social History Social History   Tobacco Use  . Smoking status: Never Smoker  . Smokeless tobacco: Never Used  Substance Use Topics  . Alcohol use: Yes    Alcohol/week: 1.0 standard drinks    Types: 1 Glasses of wine per week    Comment: occasionally   . Drug use: No     Allergies   Adhesive [tape]   Review of Systems Review of Systems   Physical Exam Triage Vital Signs ED Triage Vitals  Enc Vitals Group     BP 09/15/18 1726 (!) 149/91     Pulse  Rate 09/15/18 1726 84     Resp 09/15/18 1726 18     Temp 09/15/18 1726 98.6 F (37 C)     Temp Source 09/15/18 1726 Oral     SpO2 09/15/18 1726 100 %     Weight --      Height --      Head Circumference --      Peak Flow --      Pain Score 09/15/18 1723 8     Pain Loc --      Pain Edu? --      Excl. in [place]? --    No data found.  Updated Vital Signs BP (!) 149/91 (BP Location: Left Arm) Comment (BP Location): large cuff  Pulse 84   Temp 98.6 F (37 C) (Oral)   Resp 18   SpO2 100%    Physical Exam Vitals signs and nursing note reviewed.  Constitutional:      Appearance: Normal appearance. She is obese.  HENT:     Head: Normocephalic and atraumatic.     Nose: Nose normal.     Mouth/Throat:     Pharynx: Oropharynx is clear.  Eyes:     Conjunctiva/sclera: Conjunctivae  normal.  Pulmonary:     Effort: Pulmonary effort is normal.     Breath sounds: Normal breath sounds.  Musculoskeletal: Normal range of motion.        General: Swelling and tenderness present. No deformity or signs of injury.     Right lower leg: Edema present.     Left lower leg: Edema present.     Comments: Patient has bilateral 2+ edema in the feet as well as the calves and anterior shins.  There is no significant tenderness in the calves.  Skin:    General: Skin is warm and dry.     Findings: No erythema or rash.  Neurological:     General: No focal deficit present.     Mental Status: She is alert and oriented to person, place, and time.  Psychiatric:        Mood and Affect: Mood normal.        Behavior: Behavior normal.        Thought Content: Thought content normal.      UC Treatments / Results  Labs (all labs ordered are listed, but only abnormal results are displayed) Labs Reviewed  CBC  TSH  BASIC METABOLIC PANEL    EKG None  Radiology No results found.  Procedures Procedures (including critical care time)  Medications Ordered in UC Medications - No data to display  Initial Impression / Assessment and Plan / UC Course  I have reviewed the triage vital signs and the nursing notes.  Pertinent labs & imaging results that were available during my care of the patient were reviewed by me and considered in my medical decision making (see chart for details).    Final Clinical Impressions(s) / UC Diagnoses   Final diagnoses:  Leg edema   Discharge Instructions   None    ED Prescriptions    Medication Sig Dispense Auth. Provider   furosemide (LASIX) 20 MG tablet  (Status: Discontinued) Take 1 tablet (20 mg total) by mouth daily. 10 tablet Robyn Haber, MD   furosemide (LASIX) 20 MG tablet Take 1 tablet (20 mg total) by mouth daily. 10 tablet Robyn Haber, MD    It is hard to explain this new onset edema.  We are running a CBC, thyroid function,  and be  met to better rule out systemic disease  Patient's calves do not suggest clots.  Controlled Substance Prescriptions [place] Controlled Substance Registry consulted? Not Applicable   Robyn Haber, MD 09/15/18 [phone]
